# Patient Record
Sex: Female | Born: 1970 | Race: Black or African American | Hispanic: No | Smoking: Never smoker
Health system: Southern US, Community
[De-identification: ages and names within clinical notes are randomized; demographics above are authoritative.]

## PROBLEM LIST (undated history)

## (undated) DIAGNOSIS — R519 Headache, unspecified: Secondary | ICD-10-CM

## (undated) DIAGNOSIS — F32A Depression, unspecified: Secondary | ICD-10-CM

## (undated) DIAGNOSIS — F329 Major depressive disorder, single episode, unspecified: Secondary | ICD-10-CM

## (undated) DIAGNOSIS — G4700 Insomnia, unspecified: Secondary | ICD-10-CM

## (undated) DIAGNOSIS — Z8719 Personal history of other diseases of the digestive system: Secondary | ICD-10-CM

## (undated) DIAGNOSIS — N393 Stress incontinence (female) (male): Secondary | ICD-10-CM

## (undated) HISTORY — PX: ABDOMINAL HYSTERECTOMY: SHX81

## (undated) HISTORY — PX: CHOLECYSTECTOMY: SHX55

## (undated) HISTORY — DX: Depression, unspecified: F32.A

## (undated) HISTORY — DX: Major depressive disorder, single episode, unspecified: F32.9

## (undated) HISTORY — PX: TUBAL LIGATION: SHX77

---

## 1998-08-28 ENCOUNTER — Emergency Department (HOSPITAL_COMMUNITY): Admission: EM | Admit: 1998-08-28 | Discharge: 1998-08-28 | Payer: Self-pay | Admitting: Emergency Medicine

## 1998-08-29 ENCOUNTER — Other Ambulatory Visit: Admission: RE | Admit: 1998-08-29 | Discharge: 1998-08-29 | Payer: Self-pay | Admitting: Obstetrics and Gynecology

## 1998-09-03 ENCOUNTER — Emergency Department (HOSPITAL_COMMUNITY): Admission: EM | Admit: 1998-09-03 | Discharge: 1998-09-03 | Payer: Self-pay | Admitting: Otolaryngology

## 1999-07-19 ENCOUNTER — Emergency Department (HOSPITAL_COMMUNITY): Admission: EM | Admit: 1999-07-19 | Discharge: 1999-07-19 | Payer: Self-pay | Admitting: Emergency Medicine

## 2000-08-03 ENCOUNTER — Emergency Department (HOSPITAL_COMMUNITY): Admission: EM | Admit: 2000-08-03 | Discharge: 2000-08-03 | Payer: Self-pay | Admitting: Emergency Medicine

## 2001-10-25 ENCOUNTER — Ambulatory Visit (HOSPITAL_COMMUNITY): Admission: RE | Admit: 2001-10-25 | Discharge: 2001-10-25 | Payer: Self-pay | Admitting: Obstetrics and Gynecology

## 2001-10-25 ENCOUNTER — Encounter (INDEPENDENT_AMBULATORY_CARE_PROVIDER_SITE_OTHER): Payer: Self-pay | Admitting: Specialist

## 2002-06-14 ENCOUNTER — Encounter: Payer: Self-pay | Admitting: Emergency Medicine

## 2002-06-14 ENCOUNTER — Emergency Department (HOSPITAL_COMMUNITY): Admission: EM | Admit: 2002-06-14 | Discharge: 2002-06-15 | Payer: Self-pay | Admitting: Emergency Medicine

## 2002-06-15 ENCOUNTER — Emergency Department (HOSPITAL_COMMUNITY): Admission: EM | Admit: 2002-06-15 | Discharge: 2002-06-15 | Payer: Self-pay | Admitting: Emergency Medicine

## 2002-11-09 ENCOUNTER — Emergency Department (HOSPITAL_COMMUNITY): Admission: EM | Admit: 2002-11-09 | Discharge: 2002-11-09 | Payer: Self-pay | Admitting: Emergency Medicine

## 2003-01-10 ENCOUNTER — Encounter: Admission: RE | Admit: 2003-01-10 | Discharge: 2003-01-10 | Payer: Self-pay | Admitting: Family Medicine

## 2003-03-09 ENCOUNTER — Observation Stay (HOSPITAL_COMMUNITY): Admission: RE | Admit: 2003-03-09 | Discharge: 2003-03-10 | Payer: Self-pay | Admitting: Surgery

## 2003-03-09 ENCOUNTER — Encounter (INDEPENDENT_AMBULATORY_CARE_PROVIDER_SITE_OTHER): Payer: Self-pay | Admitting: *Deleted

## 2003-03-13 ENCOUNTER — Emergency Department (HOSPITAL_COMMUNITY): Admission: EM | Admit: 2003-03-13 | Discharge: 2003-03-13 | Payer: Self-pay | Admitting: Emergency Medicine

## 2004-07-14 ENCOUNTER — Ambulatory Visit (HOSPITAL_COMMUNITY): Admission: RE | Admit: 2004-07-14 | Discharge: 2004-07-14 | Payer: Self-pay | Admitting: Family Medicine

## 2004-08-07 ENCOUNTER — Inpatient Hospital Stay (HOSPITAL_COMMUNITY): Admission: RE | Admit: 2004-08-07 | Discharge: 2004-08-13 | Payer: Self-pay | Admitting: Psychiatry

## 2004-08-07 ENCOUNTER — Ambulatory Visit: Payer: Self-pay | Admitting: Psychiatry

## 2004-08-14 ENCOUNTER — Other Ambulatory Visit (HOSPITAL_COMMUNITY): Admission: RE | Admit: 2004-08-14 | Discharge: 2004-11-12 | Payer: Self-pay | Admitting: Psychiatry

## 2005-08-19 ENCOUNTER — Emergency Department (HOSPITAL_COMMUNITY): Admission: EM | Admit: 2005-08-19 | Discharge: 2005-08-19 | Payer: Self-pay | Admitting: Emergency Medicine

## 2007-07-06 ENCOUNTER — Other Ambulatory Visit (HOSPITAL_COMMUNITY): Admission: RE | Admit: 2007-07-06 | Discharge: 2007-07-21 | Payer: Self-pay | Admitting: Psychiatry

## 2007-07-07 ENCOUNTER — Ambulatory Visit: Payer: Self-pay | Admitting: Psychiatry

## 2007-07-22 ENCOUNTER — Ambulatory Visit (HOSPITAL_COMMUNITY): Payer: Self-pay | Admitting: Psychiatry

## 2008-08-13 ENCOUNTER — Ambulatory Visit (HOSPITAL_COMMUNITY): Admission: RE | Admit: 2008-08-13 | Discharge: 2008-08-14 | Payer: Self-pay | Admitting: Obstetrics and Gynecology

## 2008-08-13 ENCOUNTER — Encounter (INDEPENDENT_AMBULATORY_CARE_PROVIDER_SITE_OTHER): Payer: Self-pay | Admitting: Obstetrics and Gynecology

## 2009-02-10 ENCOUNTER — Emergency Department (HOSPITAL_COMMUNITY): Admission: EM | Admit: 2009-02-10 | Discharge: 2009-02-10 | Payer: Self-pay | Admitting: Family Medicine

## 2009-12-04 ENCOUNTER — Emergency Department (HOSPITAL_COMMUNITY): Admission: EM | Admit: 2009-12-04 | Discharge: 2009-12-05 | Payer: Self-pay | Admitting: Emergency Medicine

## 2010-05-26 LAB — POCT RAPID STREP A (OFFICE): Streptococcus, Group A Screen (Direct): NEGATIVE

## 2010-06-02 LAB — CBC
HCT: 27.5 % — ABNORMAL LOW (ref 36.0–46.0)
HCT: 29.4 % — ABNORMAL LOW (ref 36.0–46.0)
HCT: 36.4 % (ref 36.0–46.0)
Hemoglobin: 11.4 g/dL — ABNORMAL LOW (ref 12.0–15.0)
Hemoglobin: 8.8 g/dL — ABNORMAL LOW (ref 12.0–15.0)
Hemoglobin: 9.3 g/dL — ABNORMAL LOW (ref 12.0–15.0)
MCHC: 31.2 g/dL (ref 30.0–36.0)
MCHC: 31.7 g/dL (ref 30.0–36.0)
MCHC: 32 g/dL (ref 30.0–36.0)
MCV: 67.4 fL — ABNORMAL LOW (ref 78.0–100.0)
MCV: 68.2 fL — ABNORMAL LOW (ref 78.0–100.0)
MCV: 68.2 fL — ABNORMAL LOW (ref 78.0–100.0)
Platelets: 127 10*3/uL — ABNORMAL LOW (ref 150–400)
Platelets: 148 10*3/uL — ABNORMAL LOW (ref 150–400)
Platelets: 207 10*3/uL (ref 150–400)
RBC: 4.03 MIL/uL (ref 3.87–5.11)
RBC: 4.32 MIL/uL (ref 3.87–5.11)
RBC: 5.4 MIL/uL — ABNORMAL HIGH (ref 3.87–5.11)
RDW: 18.1 % — ABNORMAL HIGH (ref 11.5–15.5)
RDW: 18.4 % — ABNORMAL HIGH (ref 11.5–15.5)
RDW: 18.8 % — ABNORMAL HIGH (ref 11.5–15.5)
WBC: 10 10*3/uL (ref 4.0–10.5)
WBC: 5.7 10*3/uL (ref 4.0–10.5)
WBC: 8.3 10*3/uL (ref 4.0–10.5)

## 2010-06-02 LAB — BASIC METABOLIC PANEL
BUN: 4 mg/dL — ABNORMAL LOW (ref 6–23)
CO2: 27 mEq/L (ref 19–32)
Calcium: 8.7 mg/dL (ref 8.4–10.5)
Chloride: 109 mEq/L (ref 96–112)
Creatinine, Ser: 0.69 mg/dL (ref 0.4–1.2)
GFR calc Af Amer: >60 mL/min (ref >60)
GFR calc non Af Amer: >60 mL/min (ref >60)
Glucose, Bld: 124 mg/dL — ABNORMAL HIGH (ref 70–99)
Potassium: 3.4 mEq/L — ABNORMAL LOW (ref 3.5–5.1)
Sodium: 140 mEq/L (ref 135–145)

## 2010-06-02 LAB — HCG, SERUM, QUALITATIVE: Preg, Serum: NEGATIVE

## 2010-07-08 NOTE — H&P (Signed)
NAME:  Pamela Best, Pamela Best NO.:  [id number]   MEDICAL RECORD NO.:  [id number]          PATIENT TYPE:  AMB   LOCATION:  SDC                           FACILITY:  [facility]   PHYSICIAN:  Osborn Coho, M.D.   DATE OF BIRTH:  Jun 28, 1970   DATE OF ADMISSION:  DATE OF DISCHARGE:                              HISTORY & PHYSICAL   HISTORY OF PRESENT ILLNESS:  Pamela Best is a 40 year old divorced  African American female, para 2-0-1-2, presenting for a total vaginal  hysterectomy because of symptomatic uterine fibroids, menometrorrhagia,  and anemia.  The patient reports that for the past several years, she  will have a 7-day menstrual flow which is preceded by spotting for 1  week.  During the patient's actual flow, she may change her pad which  contains large clots every 30 minutes to 4 hours.  Additionally, she  will experience 3/10 cramping (based on a 10-point pain scale).  However, she would find relief from Midol.  On June 02, 2008, the  patient was referred by [facility] after having experienced  4 weeks of such vaginal bleeding.  The patient's hemoglobin was 10.3.  She had a normal TSH, prolactin.  Negative pregnancy test and a normal  endometrial biopsy.  Pelvic ultrasound revealed a uterus measuring 11.2  x 5.5 x 5.7 cm with a posterior fibroid measuring 2.1 x 1.8 x 2.3 cm.  The patient's right ovary was 2.9 x 2.0 x 2.4 cm with a paraovarian cyst  measuring 0.9 cm and a left ovary measuring 3.1 x 3.9 x 2.6 cm and a  left paraovarian cyst measuring 1.2 cm.  The patient denies any changes  in her bowel movements, urinary tract symptoms, fever, or vaginitis  symptoms, but she did admit to occasional dyspareunia.  The patient was  given the option of managing her symptoms via observation, hormonal  therapy, or surgery, to include hysterectomy.  The patient has decided  to proceed with definitive therapy in the form of hysterectomy.   PAST MEDICAL HISTORY:  OB  History:  Gravida 3, para 2-0-1-2.  The  patient had two spontaneous vaginal births.  The largest baby weighing 9  pounds 5 ounces.  GYN History:  Menarche 40 years old.  Last menstrual  period:  Jul 21, 2008.  The patient uses bilateral tubal ligation as her  method of contraception.  Does have a remote history of an abnormal Pap  smear in 1988.  She underwent cryotherapy of her cervix.  Pap smears  have been normal since.  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  The patient's last normal Pap smear was March 2010.   MEDICAL HISTORY:  1. Sickle cell trait anemia.  2. Depression.  3. Ankle fracture.   SURGICAL HISTORY:  1. 1994 bilateral tubal ligation.  2. 2005 cholecystectomy.   She denies any history of blood transfusions or problems with  anesthesia.   FAMILY HISTORY:  Positive for asthma, thyroid disease, anemia, seizures,  migraines, and breast cancer (maternal grandmother greater than age 23  and maternal aunt less than  age 21).   HABITS:  The patient does not use tobacco or illicit drugs.  She  occasionally consumes alcohol.   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   MEDICATIONS:  Valtrex 500 mg twice daily for 3 days as needed.   ALLERGIES:  The patient has no known drug allergies.  She denies any  sensitivity to latex or to shellfish.   REVIEW OF SYSTEMS:  The patient denies any chest pain, shortness of  breath, headache, vision changes, nausea, vomiting, diarrhea, fever,  pelvic pain, joint pain, skin rash, recent weight loss, night sweats,  and except as is mentioned in history of present illness.  The patient's  review of systems is otherwise negative.   PHYSICAL EXAMINATION:  VITAL SIGNS:  Blood pressure 108/72 and weight is  171.  EAR, NOSE, and THROAT:  Pupils are equal.  Hearing normal.  Throat  clear.  NECK:  Supple without masses.  There is no thyromegaly or cervical   adenopathy.  HEART:  Regular rate and rhythm.  LUNGS:  Clear.  BACK:  No CVA tenderness.  ABDOMEN:  No tenderness, masses, or organomegaly.  EXTREMITIES:  No clubbing, cyanosis, or edema.  NEUROLOGIC:  Within normal limits.  PELVIC:  EGBUS is normal.  Vagina is normal.  Cervix is nontender  without lesions.  Uterus appears 10-12 weeks' size without tenderness.  Adnexa without tenderness or masses.   IMPRESSION:  1. Menometrorrhagia.  2. Symptomatic uterine fibroids.  3. Anemia.   DISPOSITION:  A discussion was held with the patient regarding the  indications for her procedure along with its risks which include but are  not limited to reaction to anesthesia, damage to adjacent organs,  infection, excessive bleeding, premature ovarian failure, and a  possibility that an open abdominal incision may be necessary to complete  her surgery safely.  The patient verbalized understanding of these risk  and wishes to proceed with a total vaginal hysterectomy and the  possibility of a total abdominal hysterectomy at [facility] on August 13, 2008, at 9:30 a.m.      [place] J. Adline Peals.      Osborn Coho, M.D.  Electronically Signed    EJP/MEDQ  D:  08/09/2008  T:  08/10/2008  Job:  [id number]

## 2010-07-08 NOTE — Op Note (Signed)
NAME:  Pamela Best, Pamela Best              ACCOUNT NO.:  [id number]   MEDICAL RECORD NO.:  [id number]          PATIENT TYPE:  OIB   LOCATION:  9317                          FACILITY:  [facility]   PHYSICIAN:  Osborn Coho, M.D.   DATE OF BIRTH:  26-Jan-1971   DATE OF PROCEDURE:  08/13/2008  DATE OF DISCHARGE:                               OPERATIVE REPORT   PREOPERATIVE DIAGNOSES:  1. Menometrorrhagia.  2. Symptomatic fibroid.  3. Dysmenorrhea.   POSTOPERATIVE DIAGNOSES:  1. Menometrorrhagia.  2. Symptomatic fibroid.  3. Dysmenorrhea.   PROCEDURES:  1. Total vaginal hysterectomy.  2. Cystoscopy.   ATTENDING DOCTOR:  Osborn Coho, MD   ASSISTANT:  Marquis Lunch. Adline Peals.   ANESTHESIA:  General.   SPECIMEN TO PATHOLOGY:  Uterus and cervix weighing 128.7 g.   FLUIDS:  1850 mL.   ESTIMATED BLOOD LOSS:  100 mL.   URINE OUTPUT:  150 mL.   COMPLICATIONS:  None.   PROCEDURE:  This patient was taken to the operating room after the  risks, benefits, and alternatives were discussed with the patient.  The  patient verbalized understanding and consent signed and witnessed.  The  patient was placed under general anesthesia and prepped and draped in  the normal sterile fashion in the dorsal lithotomy position.  The Foley  was replaced to gravity and the bladder was filled retrograde with  lactated Ringer's to help delineate the head of the bladder as it came  very close to the external os.  The bladder was then emptied completely  and Foley left to gravity.  A single-tooth tenaculum was placed on the  anterior lip of the cervix and the cervix injected with dilute  Pitressin.  The cervix was circumscribed with a Bovie and the anterior  cul-de-sac entered without difficulty after dissecting away the bladder  from the cervix.  The posterior cul-de-sac was then entered without  difficulty.  A curved Heaney was placed on the right uterosacral  ligament, clamped and cut and suture ligated and the  same was done on  the contralateral side.  The Bonanno weighted speculum was then placed  in the posterior cul-de-sac and the curved Heaney was used to clamp the  paracervical tissue incorporating the cardinal ligament which was then  cut and suture ligated with 0 Vicryl.  The same was done on the  contralateral side.  In a sequential and bilateral fashion, the  parametrial tissue was clamped with a curved Heaney, cut and suture  ligated with 0 Vicryl and carried up to the utero-ovarian pedicles.  There is a question of whether or not there was a posterior fibroid  which was then presented to be excised out; however, it was mostly the  body of the uterus.  The remaining utero-ovarian pedicle on the  patient's right was then clamped with a large Kelly, cut and suture  ligated x2 with 0 Vicryl.  The same was done on the contralateral side;  however, there was some bleeding noted on that side and several  additional stitches of 0 Vicryl placed for hemostasis.  Good hemostasis  was noted.  The cuff was noted to be bleeding on the left which was made  hemostatic with 3-0 Vicryl figure-of-eight stitch.  The irrigation was  performed and no obvious bleeding noted.  The peritoneum was grasped  with the vaginal mucosa and the weighted speculum was replaced with a  shorter weighted speculum.  A stitch at the start of the case had been  placed in the posterior cul-de-sac and a McCall culdoplasty stitch was  placed after doing the angle stitch on the patient's right with 0 Vicryl  interrupted stitch.  A free needle was placed on the suture, grasping  the uterosacral from earlier in the case and an angle stitch was tied  after carrying the ends through the vaginal mucosa.  The cuff was  repaired with 0 Vicryl via figure-of-eight stitches and good hemostasis  was noted.  The McCall culdoplasty stitch was then tied.  Indigo carmine  had been administered and cystoscopy was performed after prepping the   urethra with Betadine and bilateral ureters were noted to efflux the  indigo carmine without difficulty.  The cystoscope was then removed, and  Foley placed to gravity.  Sponge, lap, and needle count were correct.  The patient tolerated the procedure well and is currently awaiting  extubation and transfer to the recovery room in good condition.      Osborn Coho, M.D.  Electronically Signed     AR/MEDQ  D:  08/13/2008  T:  08/14/2008  Job:  [id number]

## 2010-07-11 NOTE — Op Note (Signed)
NAME:  Pamela Best, Pamela Best                        ACCOUNT NO.:  [id number]   MEDICAL RECORD NO.:  [id number]                   PATIENT TYPE:  OBV   LOCATION:  0470                                 FACILITY:  [facility]   PHYSICIAN:  Sandria Bales. Ezzard Standing, M.D.               DATE OF BIRTH:  [birth year]   DATE OF PROCEDURE:  03/09/2003  DATE OF DISCHARGE:                                 OPERATIVE REPORT   PREOPERATIVE DIAGNOSIS:  Chronic cholecystitis with cholelithiasis.   POSTOPERATIVE DIAGNOSIS:  Chronic cholecystitis with cholelithiasis.   OPERATION/PROCEDURE:  Laparoscopic cholecystectomy with intraoperative  cholangiogram.   SURGEON:  Sandria Bales. Ezzard Standing, M.D.   FIRST ASSISTANT:  Sharlet Salina T. Hoxworth, M.D.   ANESTHESIA:  General endotracheal anesthesia.   ESTIMATED BLOOD LOSS:  Minimal.   INDICATIONS:  Pamela Best is a 40 year old black female who has had some  vague abdominal pain, maybe three or four attacks, over the last year.  She  had an ultrasound which was on January 10, 2003 which shows multiple  gallstones.  This was done at [facility].  She now comes  for laparoscopic cholecystectomy.  Indications and potential complications  were explained to the patient.  The potential complications include but are  not limited to bleeding, infection, bile duct injury, and open surgery.   DESCRIPTION OF PROCEDURE:  The patient was taken to the operating room where  she underwent a general endotracheal anesthesia.  This was supervised by Dr.  Sherrian Divers.  She was given 1 g of Ancef __________, had PAS stockings in  place.  Her abdomen was prepped with Betadine solution and sterilely draped.   She had a prior infraumbilical incision and I went through this incision to  get to the abdominal cavity.  I entered the abdominal cavity without  difficulty and placed an Hasson trocar, secured this with a 0 Vicryl suture.  I did a laparoscopic exploration revealing the right and  left lobes of the  liver unremarkable, anterior wall of the stomach unremarkable.  I could see  her colon.  Her bowel was otherwise mildly dilated but unremarkable.  There  was no mass or nodularity.  Three additional trocars were placed.  A 10 mm  Ethicon trocar in the subxiphoid location, a 5 mm Ethicon trocar in the  right mid subcostal location and a 5 mm Ethicon trocar in the right lateral  subcostal location.  The gallbladder was identified and grasped.  There was  noted to be some adhesions along the anterior gallbladder wall which had the  duodenum kind of pulled up to it.  These were fairly filmy adhesions cut  down to the cystic duct/gallbladder junction where I encircled the cystic  duct, placed endoclip along the gallbladder side of the cystic duct.   I then obtained an intraoperative cholangiogram.  I used the cut-off taut  catheter inserted through a 14-gauge Jelco catheter into the side  of the cut  cystic duct.   I then shot __________ mL of half-strength Hypaque solution under  fluoroscopy showing free flow of contrast down the cystic duct, into the  common bile duct, into the duodenum, and about the hepatic right angles.  Actually, the patient had a fairly long cystic duct and actually, size-wise  it was approximated the common bile duct.  There was no filling defect, no  mass.  It was felt to be a normal intraoperative cholangiogram.  I then  removed the Taut catheter.   I placed three clips on the cystic duct and divided the cystic duct.  I then  identified the cystic artery, placed two clips on that and divided the  cystic artery.  I then identified the triangle of Calot.  There was no  bleeding or bile leak from the triangle.  I then sharply and bluntly  dissected the gallbladder from the gallbladder bed using primarily hook  Bovie coagulation.  Prior to complete division of the gallbladder from the  gallbladder bed, I revisualized the triangle of Calot and the  gallbladder  bed.  There was no bleeding or bile leak.   The gallbladder was then removed, delivered through the umbilicus intact and  sent to pathology.  I irrigated the abdomen with about 200-300 mL of saline.  I then removed the trocars under direct visualization.  The umbilicus trocar  was closed with 0 Vicryl suture.  Skin at each site was closed with 5-0  Monocryl suture, painted with tincture of Benzoin and steri-stripped.   Sponge and needle counts were correct at the end of the case.  The patient  tolerated the procedure well and was transported to the recovery room in  good condition.                                               Sandria Bales. Ezzard Standing, M.D.    DHN/MEDQ  D:  03/09/2003  T:  03/09/2003  Job:  [id number]   cc:   Duwayne Heck L. Mahaffey, M.D.  [address]  [place]  [place] [address]  Fax: [phone]

## 2010-07-11 NOTE — Op Note (Signed)
NAME:  Pamela Best, Pamela Best                        ACCOUNT NO.:  [id number]   MEDICAL RECORD NO.:  [id number]                   PATIENT TYPE:  AMB   LOCATION:  SDC                                  [facility]ILITY:  [facility]   PHYSICIAN:  Malva Limes, MD                   DATE OF BIRTH:  1970-06-24   DATE OF PROCEDURE:  10/25/2001  DATE OF DISCHARGE:                                 OPERATIVE REPORT   PREOPERATIVE DIAGNOSES:  1. Menometrorrhagia.  2. Dysmenorrhea.  3. Postcoital bleeding.   POSTOPERATIVE DIAGNOSES:  1. Menometrorrhagia.  2. Dysmenorrhea.  3. Postcoital bleeding.  4. Small endocervical polyp.  5. Multiple small endometrial polyps.   PROCEDURE:  1. Dilatation and curettage.  2. Hysteroscopy.   SURGEON:  Malva Limes, MD   ANESTHESIA:  General.   ESTIMATED BLOOD LOSS:  25 cc.   ANTIBIOTICS:  Ancef 1 g.   SPECIMENS:  Endometrial and endocervical curettings to pathology.   COMPLICATIONS:  None.   DRAINS:  Red rubber catheter to bladder.   PROCEDURE:  The patient was taken to the operating room where she was placed  in a dorsal supine position after general anesthesia was administered  without complications.  She was then placed in the dorsal lithotomy position  and prepped with  Hibiclens.  She was draped in the usual fashion for this  procedure.  A sterile speculum was placed in the vagina.  Single tooth  tenaculum was applied to the anterior cervical lip.  The uterus was then  sounded to 9 cm.  The cervical os was then serially dilated to a 31 [place].  The hysteroscope was advanced through the endocervical canal where a small  endocervical polyp was seen to arise from 9 o'clock.  On entering the  uterine cavity both ostia were visualized and appeared to be normal.  There  were small endometrial polyps seen within the ostia.  There was also one  small endometrial polyp seen on the anterior fundal surface.  At this point  the hysteroscope was removed.  Endocervical  curettage was performed followed  by endometrial curetting.  Copious amounts of endometrial tissue were  removed.  This was all sent to pathology.  The patient tolerated the  procedure well.  She was taken to the  recovery room in stable condition.  Instrument and lap counts were correct  x1.  The patient will be discharged to home.  She will be sent home with the  Anaprox double strength to take p.r.n.  She will follow up in the office in  four weeks.                                               Malva Limes, MD    MA/MEDQ  D:  10/25/2001  T:  10/25/2001  Job:  [id number]

## 2010-07-11 NOTE — Discharge Summary (Signed)
NAME:  Pamela Best, Pamela Best NO.:  [id number]   MEDICAL RECORD NO.:  [id number]          PATIENT TYPE:  IPS   LOCATION:  0506                          FACILITY:  [facility]   PHYSICIAN:  Geoffery Lyons, M.D.      DATE OF BIRTH:  Apr 04, 1970   DATE OF ADMISSION:  08/07/2004  DATE OF DISCHARGE:  08/13/2004                                 DISCHARGE SUMMARY   CHIEF COMPLAINT AND PRESENT ILLNESS:  This was the first admission to [facility] for this 40 year old divorced African-American female  voluntarily admitted.  History of depression.  Going through a bad  relationship, crying, suicidal ideation, cutting herself, would not know how  to do it, not eating, not sleeping, isolating.  Had bottle of liquor and  drank it to go to sleep, racing thoughts, worried about going home and being  alone.   PAST PSYCHIATRIC HISTORY:  First time at [facility].   ALCOHOL/DRUG HISTORY:  Denies active use of any substances.   MEDICAL HISTORY:  Noncontributory.   MEDICATIONS:  Valtrex.   PHYSICAL EXAMINATION:  Performed and failed to show any acute findings.   LABORATORY DATA:  CBC with white blood cells 8.4, hematocrit 38.3.  Blood  chemistry with potassium 3.3, glucose 110.  SGOT 19, SGPT 14.   MENTAL STATUS EXAM:  Fully alert, cooperative female.  Fair eye contact.  Speech clear, normal rate, tempo and production.  Mood depressed, hurting.  Affect depressed.  Thought processes logical, coherent and relevant.  No  delusions.  No active suicidal or homicidal ideation.  No hallucinations.  Cognition was well-preserved.   ADMISSION DIAGNOSES:   AXIS I:  Depressive disorder not otherwise specified.   AXIS II:  No diagnosis.   AXIS III:  Genital herpes.   AXIS IV:  Moderate.   AXIS V:  Global Assessment of Functioning upon admission 30; highest Global  Assessment of Functioning in the last year 65-70.   HOSPITAL COURSE:  She was admitted.  She was started in  individual and group  psychotherapy.  She was given Ambien 10 mg at bedtime for sleep, Ativan 0.5  mg every six hours as needed for anxiety.  She was given Risperdal 0.5 mg at  night and she was started on Zoloft 25 mg per day, that was increased to  Zoloft 50 mg per day.  Risperdal was increased to 0.5 mg at bedtime.  Initially very depressed and tearful.  Broke up with her partner and felt  suicidal.  Sense of hopelessness, feeling of racing thoughts, no hope.  Tolerating the Zoloft and the Risperdal well.  She was able to open up and  told how the breakup has distressed her.  Endorsed that the medications were  helping her to sleep and quiet her thoughts.  On June 19th, endorsed that  she continued to have a hard time, broke up with the partner, same-sex).  She was working in the school system.  She was not working during the  summer.  Endorsed difficulty with the fact that the children were going to  stay with their  father.  She has been isolating, feeling worse.  She was  able to start opening up, feeling a sense of loss, ruminating about the  relationship, tearful, able to open up and fully express her feelings.  Endorsed poor self-esteem, poor self-image.  Second-guessing herself.  Very  upset.  Continued to work on the grief.  There was a family session with her  sister and mother.  She was able to process some other feelings and, on June  21st, she felt better.  Endorsed no suicidal ideation, no homicidal  ideation, no hallucinations, no delusions.  Evidenced increased insight.  Dealing with the loss but willing to pursue further treatment.  Was coming  to intensive outpatient program for further stabilization.   DISCHARGE DIAGNOSES:   AXIS I:  Major depression, single episode.   AXIS II:  No diagnosis.   AXIS III:  Genital herpes.   AXIS IV:  Moderate.   AXIS V:  Global Assessment of Functioning upon discharge 50.   DISCHARGE MEDICATIONS:  1.  Zoloft 50 mg per day.  2.   Risperdal 0.5 mg at night.  3.  Ambien 10 mg at night for sleep.   FOLLOW UP:  Mental health intensive outpatient program at [facility].       IL/MEDQ  D:  09/04/2004  T:  09/05/2004  Job:  [id number]

## 2010-10-17 ENCOUNTER — Inpatient Hospital Stay (INDEPENDENT_AMBULATORY_CARE_PROVIDER_SITE_OTHER)
Admission: RE | Admit: 2010-10-17 | Discharge: 2010-10-17 | Disposition: A | Discharge status: Home / Self Care | Payer: BC Managed Care – PPO | Source: Ambulatory Visit | Attending: Emergency Medicine | Admitting: Emergency Medicine

## 2010-10-17 DIAGNOSIS — M65839 Other synovitis and tenosynovitis, unspecified forearm: Secondary | ICD-10-CM

## 2010-10-17 DIAGNOSIS — M65849 Other synovitis and tenosynovitis, unspecified hand: Secondary | ICD-10-CM

## 2010-12-19 ENCOUNTER — Inpatient Hospital Stay (INDEPENDENT_AMBULATORY_CARE_PROVIDER_SITE_OTHER)
Admission: RE | Admit: 2010-12-19 | Discharge: 2010-12-19 | Disposition: A | Discharge status: Home / Self Care | Payer: BC Managed Care – PPO | Source: Ambulatory Visit | Attending: Family Medicine | Admitting: Family Medicine

## 2010-12-19 DIAGNOSIS — H109 Unspecified conjunctivitis: Secondary | ICD-10-CM

## 2010-12-19 DIAGNOSIS — J029 Acute pharyngitis, unspecified: Secondary | ICD-10-CM

## 2010-12-19 LAB — POCT RAPID STREP A: Streptococcus, Group A Screen (Direct): NEGATIVE

## 2011-03-23 ENCOUNTER — Other Ambulatory Visit: Payer: Self-pay | Admitting: Obstetrics and Gynecology

## 2011-03-23 DIAGNOSIS — Z1231 Encounter for screening mammogram for malignant neoplasm of breast: Secondary | ICD-10-CM

## 2011-03-31 ENCOUNTER — Ambulatory Visit: Payer: BC Managed Care – PPO

## 2012-04-11 ENCOUNTER — Other Ambulatory Visit: Payer: Self-pay | Admitting: Obstetrics and Gynecology

## 2013-07-11 ENCOUNTER — Other Ambulatory Visit: Payer: Self-pay

## 2013-07-11 DIAGNOSIS — Z1231 Encounter for screening mammogram for malignant neoplasm of breast: Secondary | ICD-10-CM

## 2013-07-21 ENCOUNTER — Encounter (INDEPENDENT_AMBULATORY_CARE_PROVIDER_SITE_OTHER): Payer: Self-pay

## 2013-07-21 ENCOUNTER — Ambulatory Visit
Admission: RE | Admit: 2013-07-21 | Discharge: 2013-07-21 | Disposition: A | Discharge status: Home / Self Care | Payer: BC Managed Care – PPO | Source: Ambulatory Visit

## 2013-07-21 DIAGNOSIS — Z1231 Encounter for screening mammogram for malignant neoplasm of breast: Secondary | ICD-10-CM

## 2014-07-31 ENCOUNTER — Telehealth: Payer: Self-pay | Admitting: Internal Medicine

## 2014-07-31 NOTE — Telephone Encounter (Signed)
Called pt to scheduled new pt appt. left message

## 2014-09-03 ENCOUNTER — Telehealth: Payer: Self-pay | Admitting: Internal Medicine

## 2014-09-03 NOTE — Telephone Encounter (Signed)
LEFT MESSAGE FOR PATIENT TO CONFIRM NP APPT FOR 07/12 @ 10:30 W/DR. MOHAMED

## 2014-09-04 ENCOUNTER — Ambulatory Visit (HOSPITAL_BASED_OUTPATIENT_CLINIC_OR_DEPARTMENT_OTHER): Payer: BC Managed Care – PPO | Admitting: Hematology

## 2014-09-04 ENCOUNTER — Ambulatory Visit: Payer: BC Managed Care – PPO

## 2014-09-04 ENCOUNTER — Ambulatory Visit (HOSPITAL_BASED_OUTPATIENT_CLINIC_OR_DEPARTMENT_OTHER): Payer: BC Managed Care – PPO

## 2014-09-04 ENCOUNTER — Telehealth: Payer: Self-pay | Admitting: Hematology

## 2014-09-04 ENCOUNTER — Encounter: Payer: Self-pay | Admitting: Hematology

## 2014-09-04 ENCOUNTER — Encounter (INDEPENDENT_AMBULATORY_CARE_PROVIDER_SITE_OTHER): Payer: Self-pay

## 2014-09-04 VITALS — BP 117/75 | HR 123 | Temp 98.2°F | Resp 18 | Wt 208.7 lb

## 2014-09-04 DIAGNOSIS — D649 Anemia, unspecified: Secondary | ICD-10-CM

## 2014-09-04 LAB — CBC & DIFF AND RETIC
BASO%: 0.3 % (ref 0.0–2.0)
Basophils Absolute: 0 10*3/uL (ref 0.0–0.1)
EOS%: 0.9 % (ref 0.0–7.0)
Eosinophils Absolute: 0.1 10*3/uL (ref 0.0–0.5)
HCT: 38.5 % (ref 34.8–46.6)
HGB: 12.2 g/dL (ref 11.6–15.9)
Immature Retic Fract: 6.6 % (ref 1.60–10.00)
LYMPH%: 31.6 % (ref 14.0–49.7)
MCH: 21.9 pg — ABNORMAL LOW (ref 25.1–34.0)
MCHC: 31.7 g/dL (ref 31.5–36.0)
MCV: 69.2 fL — ABNORMAL LOW (ref 79.5–101.0)
MONO#: 0.3 10*3/uL (ref 0.1–0.9)
MONO%: 5.2 % (ref 0.0–14.0)
NEUT#: 4 10*3/uL (ref 1.5–6.5)
NEUT%: 62 % (ref 38.4–76.8)
Platelets: 249 10*3/uL (ref 145–400)
RBC: 5.56 10*6/uL — ABNORMAL HIGH (ref 3.70–5.45)
RDW: 17 % — ABNORMAL HIGH (ref 11.2–14.5)
Retic %: 1.4 % (ref 0.70–2.10)
Retic Ct Abs: 77.84 10*3/uL (ref 33.70–90.70)
WBC: 6.4 10*3/uL (ref 3.9–10.3)
lymph#: 2 10*3/uL (ref 0.9–3.3)

## 2014-09-04 LAB — MORPHOLOGY: PLT EST: ADEQUATE

## 2014-09-04 LAB — IRON AND TIBC CHCC
%SAT: 11 % — ABNORMAL LOW (ref 21–57)
Iron: 41 ug/dL (ref 41–142)
TIBC: 361 ug/dL (ref 236–444)
UIBC: 320 ug/dL (ref 120–384)

## 2014-09-04 LAB — FERRITIN CHCC: Ferritin: 76 ng/ml (ref 9–269)

## 2014-09-04 NOTE — Progress Notes (Signed)
Checked in new pt with no financial concerns. °

## 2014-09-04 NOTE — Progress Notes (Signed)
[facility]  Telephone:([phone] [phone]  Clinic New consult Note   Patient Care Team: Dorothyann Pengobyn Sanders, MD as PCP - General (Internal Medicine) 09/04/2014  CHIEF COMPLAINTS/PURPOSE OF CONSULTATION:  Anemia   HISTORY OF PRESENTING ILLNESS:  Pamela Best 44 y.o. female is here because of anemia   She was found to have abnormal CBC from about 23 years ago, when she had her first child.  She denies recent chest pain on exertion, she does have shortness of breath on heavy exertion, no pre-syncopal episodes, or palpitations. She had not noticed any recent bleeding such as epistaxis, hematuria or hematochezia The patient denies over the counter NSAID ingestion. She is not on antiplatelets agents. Her never had colonoscopy.  She had no prior history or diagnosis of cancer. Her age appropriate screening programs are up-to-date. She denies any pica and eats a variety of diet. She never donated blood or received blood transfusion The patient was prescribed oral iron supplements and she takes fusion plus 1 tab daily for the past year.   She had hysrectomy in 2010, she had heavy menstrual period (lasts 30 days every a few months) for a few years before her surgery.   She feels well overall, has mild fatigue. She works full-time at [employer], tolerating her routine activities without difficult. She complains of mild numbness and tingling on fingers and toes, and some time skin color change (white) on fingers.   MEDICAL HISTORY:  Past Medical History  Diagnosis Date  . Depression     SURGICAL HISTORY: Past Surgical History  Procedure Laterality Date  . Cholecystectomy    . Abdominal hysterectomy      SOCIAL HISTORY: History   Social History  . Marital Status: Divorced    Spouse Name: N/A  . Number of Children: 2, age of 46 and 7420  . Years of Education: N/A   Occupational History  . She works for [employer]   Social  History Main Topics  . Smoking status: Never Smoker   . Smokeless tobacco: Not on file  . Alcohol Use: Yes     Comment: socail drinker   . Drug Use: No  . Sexual Activity: Not on file   Other Topics Concern  . Not on file   Social History Narrative  . No narrative on file    FAMILY HISTORY: Family History  Problem Relation Age of Onset  . Hypothyroidism Mother   . Thalassemia Mother   . Cancer Father [id number]    prostate cancer   . Hypertension Sister   . Cancer Maternal Grandmother     breast cancer   . Cancer Maternal Grandfather     bone cancer    ALLERGIES:  has No Known Allergies.  MEDICATIONS:  Current Outpatient Prescriptions  Medication Sig Dispense Refill  . escitalopram (LEXAPRO) 20 MG tablet Take 20 mg by mouth daily.  2  . Iron-FA-B Cmp-C-Biot-Probiotic (FUSION PLUS) CAPS Take 1 capsule by mouth daily.    Marland Kitchen. levocetirizine (XYZAL) 5 MG tablet Take 5 mg by mouth at bedtime.  5  . LIVIXIL PAK cream Apply topically as needed.    Marland Kitchen. omeprazole (PRILOSEC) 40 MG capsule Take 40 mg by mouth daily.  5  . valACYclovir (VALTREX) 500 MG tablet TAKE 1 TABLET BY MOUTH EVERY DAY 30 tablet PRN  . Vitamin D, Ergocalciferol, (DRISDOL) 50000 UNITS CAPS capsule TK ONE C PO TWICE WEEKLY  0   No current facility-administered medications for  this visit.    REVIEW OF SYSTEMS:   Constitutional: Denies fevers, chills or abnormal night sweats Eyes: Denies blurriness of vision, double vision or watery eyes Ears, nose, mouth, throat, and face: Denies mucositis or sore throat Respiratory: Denies cough, dyspnea or wheezes Cardiovascular: Denies palpitation, chest discomfort or lower extremity swelling Gastrointestinal:  Denies nausea, heartburn or change in bowel habits Skin: Denies abnormal skin rashes Lymphatics: Denies new lymphadenopathy or easy bruising Neurological:Denies numbness, tingling or new weaknesses Behavioral/Psych: Mood is stable, no new changes  All other systems were  reviewed with the patient and are negative.  PHYSICAL EXAMINATION: ECOG PERFORMANCE STATUS: 0 - Asymptomatic  Filed Vitals:   09/04/14 1059  BP: 117/75  Pulse: 123  Temp: 98.2 F (36.8 C)  Resp: 18   Filed Weights   09/04/14 1059  Weight: 208 lb 11.2 oz (94.666 kg)    GENERAL:alert, no distress and comfortable SKIN: skin color, texture, turgor are normal, no rashes or significant lesions EYES: normal, conjunctiva are pink and non-injected, sclera clear OROPHARYNX:no exudate, no erythema and lips, buccal mucosa, and tongue normal  NECK: supple, thyroid normal size, non-tender, without nodularity LYMPH:  no palpable lymphadenopathy in the cervical, axillary or inguinal LUNGS: clear to auscultation and percussion with normal breathing effort HEART: regular rate & rhythm and no murmurs and no lower extremity edema ABDOMEN:abdomen soft, non-tender and normal bowel sounds Musculoskeletal:no cyanosis of digits and no clubbing  PSYCH: alert & oriented x 3 with fluent speech NEURO: no focal motor/sensory deficits  LABORATORY DATA:  I have reviewed the data as listed CBC Latest Ref Rng 09/04/2014 08/14/2008 08/13/2008  WBC 3.9 - 10.3 10e3/uL 6.4 8.3 10.0  Hemoglobin 11.6 - 15.9 g/dL 16.1 0.9(U) 9.3 DELTA CHECK NOTED(L)  Hematocrit 34.8 - 46.6 % 38.5 27.5(L) 29.4(L)  Platelets 145 - 400 10e3/uL 249 127 PLATELET COUNT CONFIRMED BY SMEAR(L) 148 DELTA CHECK NOTED(L)    CMP Latest Ref Rng 08/14/2008  Glucose 70 - 99 mg/dL 045(W)  BUN 6 - 23 mg/dL 4(L)  Creatinine 0.4 - 1.2 mg/dL 0.98  Sodium 119 - 147 mEq/L 140  Potassium 3.5 - 5.1 mEq/L 3.4(L)  Chloride 96 - 112 mEq/L 109  CO2 19 - 32 mEq/L 27  Calcium 8.4 - 10.5 mg/dL 8.7    Her outside lab from 06/29/2014 reviewed CBC: Hemoglobin 11.9, MCV 66, WBC 5.7K, platelet 264K TSH 2.09, normal Vitamin B12: 412, normal CMP: Within normal limits except serum glucose 100 mg/dl   Labs from 82/95/6213: CBC: Hemoglobin 12.3, hematocrit 37.7,  MCV 66, platelet 250 1K, WBC 6.4K Serum iron 42, U IBC 323, iron saturation 12% Ferritin 50, E 12 305, HbA1c 6.1%  RADIOGRAPHIC STUDIES: I have personally reviewed the radiological images as listed and agreed with the findings in the report. No results found.  ASSESSMENT & PLAN:  44 year old African-American female, with family history of thalassemia in her mother, presented with long-standing history of mild anemia.   1. Microcytic anemia -She has a mild anemia with a hemoglobin around 12g/dl, with profound low MCV in mid 60 range. She has family history of thalassemia in her mother, she most likely is a thalassemia carrier. She does not know which type of thalassemia her mother has. -I'll check hemoglobin electrophoresis to see if she has beta thalassemia. If her hemoglobin electrophoresis normal, I'll check thalassemia off gene mutation. -I'll also repeat ferritin and iron study, to see if she has iron deficiency. TSH was previous checked which was normal. -I think is  the other etiology of anemia is much less likely, giving the profound low MCV.  -Giving her peripheral neuropathy, I'll also check methylmalonic acid level, to see if she has B12 deficiency. Her serum B12 level was normal.  2. Depression -Continue medication  3. Peripheral neuropathy -Unknown etiology. I'll check methylmalonic acid level  Follow-up: I'll see her back in 1 month's to review the above test results.   All questions were answered. The patient knows to call the clinic with any problems, questions or concerns. I spent 40 minutes counseling the patient face to face. The total time spent in the appointment was 55 minutes and more than 50% was on counseling.     Malachy Mood, MD 09/05/2014 9:24 AM

## 2014-09-04 NOTE — Telephone Encounter (Signed)
Pt confirmed labs/ov per 07/12 POF, gave pt AVS and Calendar... KJ °

## 2014-09-07 LAB — HEMOGLOBINOPATHY EVALUATION
HEMOGLOBIN OTHER: 0 %
HGB A2 QUANT: 5.5 % — AB (ref 2.2–3.2)
Hgb A: 91.1 % — ABNORMAL LOW (ref 96.8–97.8)
Hgb F Quant: 3.4 % — ABNORMAL HIGH (ref 0.0–2.0)
Hgb S Quant: 0 %

## 2014-09-07 LAB — METHYLMALONIC ACID, SERUM: Methylmalonic Acid, Quant: 80 nmol/L — ABNORMAL LOW (ref 87–318)

## 2014-10-05 ENCOUNTER — Ambulatory Visit (HOSPITAL_BASED_OUTPATIENT_CLINIC_OR_DEPARTMENT_OTHER): Payer: BC Managed Care – PPO | Admitting: Hematology

## 2014-10-05 ENCOUNTER — Telehealth: Payer: Self-pay | Admitting: Hematology

## 2014-10-05 ENCOUNTER — Encounter: Payer: Self-pay | Admitting: Hematology

## 2014-10-05 VITALS — BP 126/68 | HR 92 | Temp 98.9°F | Resp 18 | Ht 62.0 in | Wt 214.1 lb

## 2014-10-05 DIAGNOSIS — G629 Polyneuropathy, unspecified: Secondary | ICD-10-CM | POA: Diagnosis not present

## 2014-10-05 DIAGNOSIS — F329 Major depressive disorder, single episode, unspecified: Secondary | ICD-10-CM

## 2014-10-05 DIAGNOSIS — D563 Thalassemia minor: Secondary | ICD-10-CM

## 2014-10-05 DIAGNOSIS — D509 Iron deficiency anemia, unspecified: Secondary | ICD-10-CM | POA: Diagnosis not present

## 2014-10-05 DIAGNOSIS — R05 Cough: Secondary | ICD-10-CM

## 2014-10-05 NOTE — Progress Notes (Signed)
[facility]  Telephone:(336) [phone] Fax:(336) [phone]  Clinic New consult Note   Patient Care Team: Dorothyann Peng, MD as PCP - General (Internal Medicine) 10/05/2014   CHIEF COMPLAINTS/PURPOSE OF CONSULTATION:  Anemia   HISTORY OF PRESENTING ILLNESS:  Pamela Best 44 y.o. female is here because of anemia   She was found to have abnormal CBC from about 23 years ago, when she had her first child.  She denies recent chest pain on exertion, she does have shortness of breath on heavy exertion, no pre-syncopal episodes, or palpitations. She had not noticed any recent bleeding such as epistaxis, hematuria or hematochezia The patient denies over the counter NSAID ingestion. She is not on antiplatelets agents. Her never had colonoscopy.  She had no prior history or diagnosis of cancer. Her age appropriate screening programs are up-to-date. She denies any pica and eats a variety of diet. She never donated blood or received blood transfusion The patient was prescribed oral iron supplements and she takes fusion plus 1 tab daily for the past year.   She had hysrectomy in 2010, she had heavy menstrual period (lasts 30 days every a few months) for a few years before her surgery.   She feels well overall, has mild fatigue. She works full-time at [employer], tolerating her routine activities without difficult. She complains of mild numbness and tingling on fingers and toes, and some time skin color change (white) on fingers.   INTERIM HISTORY Pamela Best returns for follow-up and discuss her lab test results. She has been having episodic dry cough, no fever or sputum production. Has been treated for presumed acid reflux by her primary care physician. She was coughing quite a bit during her visit with me today. She otherwise denies any new symptoms, good energy level and eating well.   MEDICAL HISTORY:  Past Medical History  Diagnosis Date  . Depression     SURGICAL  HISTORY: Past Surgical History  Procedure Laterality Date  . Cholecystectomy    . Abdominal hysterectomy      SOCIAL HISTORY: History   Social History  . Marital Status: Divorced    Spouse Name: N/A  . Number of Children: 2, age of 63 and 27  . Years of Education: N/A   Occupational History  . She works for [employer]   Social History Main Topics  . Smoking status: Never Smoker   . Smokeless tobacco: Not on file  . Alcohol Use: Yes     Comment: socail drinker   . Drug Use: No  . Sexual Activity: Not on file   Other Topics Concern  . Not on file   Social History Narrative  . No narrative on file    FAMILY HISTORY: Family History  Problem Relation Age of Onset  . Hypothyroidism Mother   . Thalassemia Mother   . Cancer Father 83    prostate cancer   . Hypertension Sister   . Cancer Maternal Grandmother     breast cancer   . Cancer Maternal Grandfather     bone cancer    ALLERGIES:  has No Known Allergies.  MEDICATIONS:  Current Outpatient Prescriptions  Medication Sig Dispense Refill  . escitalopram (LEXAPRO) 20 MG tablet Take 20 mg by mouth daily.  2  . Iron-FA-B Cmp-C-Biot-Probiotic (FUSION PLUS) CAPS Take 1 capsule by mouth daily.    Marland Kitchen levocetirizine (XYZAL) 5 MG tablet Take 5 mg by mouth at bedtime.  5  . LIVIXIL PAK cream  Apply topically as needed.    Marland Kitchen omeprazole (PRILOSEC) 40 MG capsule Take 40 mg by mouth daily.  5  . valACYclovir (VALTREX) 500 MG tablet TAKE 1 TABLET BY MOUTH EVERY DAY 30 tablet PRN  . Vitamin D, Ergocalciferol, (DRISDOL) 50000 UNITS CAPS capsule TK ONE C PO TWICE WEEKLY  0   No current facility-administered medications for this visit.    REVIEW OF SYSTEMS:   Constitutional: Denies fevers, chills or abnormal night sweats Eyes: Denies blurriness of vision, double vision or watery eyes Ears, nose, mouth, throat, and face: Denies mucositis or sore throat Respiratory: Denies cough, dyspnea or wheezes Cardiovascular:  Denies palpitation, chest discomfort or lower extremity swelling Gastrointestinal:  Denies nausea, heartburn or change in bowel habits Skin: Denies abnormal skin rashes Lymphatics: Denies new lymphadenopathy or easy bruising Neurological:Denies numbness, tingling or new weaknesses Behavioral/Psych: Mood is stable, no new changes  All other systems were reviewed with the patient and are negative.  PHYSICAL EXAMINATION: ECOG PERFORMANCE STATUS: 0 - Asymptomatic  Filed Vitals:   10/05/14 0918  BP: 126/68  Pulse: 92  Temp: 98.9 F (37.2 C)  Resp: 18   Filed Weights   10/05/14 0918  Weight: 214 lb 1.6 oz (97.115 kg)    GENERAL:alert, no distress and comfortable SKIN: skin color, texture, turgor are normal, no rashes or significant lesions EYES: normal, conjunctiva are pink and non-injected, sclera clear OROPHARYNX:no exudate, no erythema and lips, buccal mucosa, and tongue normal  NECK: supple, thyroid normal size, non-tender, without nodularity LYMPH:  no palpable lymphadenopathy in the cervical, axillary or inguinal LUNGS: clear to auscultation and percussion with normal breathing effort HEART: regular rate & rhythm and no murmurs and no lower extremity edema ABDOMEN:abdomen soft, non-tender and normal bowel sounds Musculoskeletal:no cyanosis of digits and no clubbing  PSYCH: alert & oriented x 3 with fluent speech NEURO: no focal motor/sensory deficits  LABORATORY DATA:  I have reviewed the data as listed CBC Latest Ref Rng 09/04/2014 08/14/2008 08/13/2008  WBC 3.9 - 10.3 10e3/uL 6.4 8.3 10.0  Hemoglobin 11.6 - 15.9 g/dL 16.1 0.9(U) 9.3 DELTA CHECK NOTED(L)  Hematocrit 34.8 - 46.6 % 38.5 27.5(L) 29.4(L)  Platelets 145 - 400 10e3/uL 249 127 PLATELET COUNT CONFIRMED BY SMEAR(L) 148 DELTA CHECK NOTED(L)    CMP Latest Ref Rng 08/14/2008  Glucose 70 - 99 mg/dL 045(W)  BUN 6 - 23 mg/dL 4(L)  Creatinine 0.4 - 1.2 mg/dL 0.98  Sodium 119 - 147 mEq/L 140  Potassium 3.5 - 5.1 mEq/L  3.4(L)  Chloride 96 - 112 mEq/L 109  CO2 19 - 32 mEq/L 27  Calcium 8.4 - 10.5 mg/dL 8.7   Hemoglobinopathy evaluation  Status: Finalresult Visible to patient:  MyChart Nextappt: Today at 09:15 AM in Oncology Mosetta Putt, Terrace Arabia, MD) Dx:  Anemia, unspecified anemia type            Ref Range 46mo ago    Hgb A 96.8 - 97.8 % 91.1 (L)   Hgb A2 Quant 2.2 - 3.2 % 5.5 (H)   Hgb F Quant 0.0 - 2.0 % 3.4 (H)   Comments: Patient State         HbA2 Level     HbF Level-----------------------------------------------------------Heterozygous B-thalassemia    4-9%        1-5%Homozygous B-thalassemia  Normal or increased  80-100%Heterozygous HPFH        Less than 1.5%    10-20%Homozygous HPFH         Absent  100%     Hgb S Quant 0.0 % 0.0   Hemoglobin Other 0.0 % 0.0   Comments: Interpretation-------------- Elevated A2 levels can be seen in Beta Thalassemia Trait and a varietyof unstable hemoglobin variants. Elevations can also be seen inacquired conditions such as leukemias, megaloblastic anemia andhyperthyroidism. Accurate  interpretation of these results shouldinclude family history, hemoglobin, hematocrit, and mean corpuscularvolume results, red cell morphology, serum iron binding capacity,knowledge of recent transfusions and other pertinent clinicalfindings. Hb-F is  increased for the patient's reported age. Hb-A2 is of normalconcentration. No abnormal hemoglobin is identified. The elevated Hb-Fmay be due to an acquired increase with rapid regeneration oferythropoiesis from any cause. Alternatively, the elevated Hb-F  may be due to the hereditarypersistence of Hb-F, which is of no known physiologic significance. Reviewed by Dallas Breeding, MD, PhD, [facility] (Electronic Signature onFile)         Results for ADRIONA, KANEY (MRN [id number]) as of 10/05/2014 07:13  Ref. Range 09/04/2014 12:34  Iron Latest Ref Range: 41-142  ug/dL 41  UIBC Latest Ref Range: 120-384 ug/dL 409  TIBC Latest Ref Range: 236-444 ug/dL 811  %SAT Latest Ref Range: 21-57 % 11 (L)  Ferritin Latest Ref Range: 9-269 ng/ml 76    Her outside lab from 06/29/2014 reviewed CBC: Hemoglobin 11.9, MCV 66, WBC 5.7K, platelet 264K TSH 2.09, normal Vitamin B12: 412, normal CMP: Within normal limits except serum glucose 100 mg/dl   Labs from 91/47/8295: CBC: Hemoglobin 12.3, hematocrit 37.7, MCV 66, platelet 250 1K, WBC 6.4K Serum iron 42, U IBC 323, iron saturation 12% Ferritin 50, E 12 305, HbA1c 6.1%  RADIOGRAPHIC STUDIES: I have personally reviewed the radiological images as listed and agreed with the findings in the report. No results found.  ASSESSMENT & PLAN:  44 year old African-American female, with family history of thalassemia in her mother, presented with long-standing history of mild anemia.   1. Beta thalassemia trait, mild iron deficiency -She has a mild anemia with a hemoglobin around 12g/dl, with profound low MCV in mid 60 range. She has family history of thalassemia in her mother, and her hemoglobin electrophoresis showed elevated in the Hb A2 and F, consistent with beta thalassemia.  -Her our study showed normal ferritin and serum iron level, but slightly low iron saturation. She is taking over-the-counter iron pill once a day, I encouraged her to increase to 2 tablets a day. -Her methylmalonic level was low, against B12 deficiency -We reviewed beta thalassemia in general, she is likely a carrier, and will do well, no elevated reticular count, no significant hemoptysis. I suggest her to take multivitamin including folic acid and B12.  -She has to children, her daughter is mild anemic, I suggest her to check thalassemia anemia also.  2. Depression -Continue medication  3. Peripheral neuropathy -Unknown etiology. No lab evidence of B12 deficiency  4. Dry cough -I suggest her to follow-up with her primary care physician,  make sure have a chest x-ray to ruled out pulmonary disease -Is a possibility including acid reflux, or asthma  Follow-up: I'll see her back in 6 months with repeated CBC and iron study   All questions were answered. The patient knows to call the clinic with any problems, questions or concerns. I spent 20 minutes counseling the patient face to face. The total time spent in the appointment was 25 minutes and more than 50% was on counseling.     Malachy Mood, MD 10/05/2014 9:34 AM

## 2014-10-05 NOTE — Telephone Encounter (Signed)
per pof to sch pt appt-gave pt copy of avs °

## 2015-02-27 ENCOUNTER — Telehealth: Payer: Self-pay | Admitting: Hematology

## 2015-02-27 NOTE — Telephone Encounter (Signed)
Patient called to cx 1/5 and 1/12 appointments for lab/fu. 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

## 2015-02-28 ENCOUNTER — Other Ambulatory Visit: Payer: BC Managed Care – PPO

## 2015-03-07 ENCOUNTER — Ambulatory Visit: Payer: BC Managed Care – PPO | Admitting: Hematology

## 2015-09-06 ENCOUNTER — Other Ambulatory Visit: Payer: Self-pay | Admitting: Obstetrics and Gynecology

## 2015-09-06 DIAGNOSIS — R922 Inconclusive mammogram: Secondary | ICD-10-CM

## 2015-09-09 ENCOUNTER — Ambulatory Visit
Admission: RE | Admit: 2015-09-09 | Discharge: 2015-09-09 | Disposition: A | Discharge status: Home / Self Care | Payer: BC Managed Care – PPO | Source: Ambulatory Visit | Attending: Obstetrics and Gynecology | Admitting: Obstetrics and Gynecology

## 2015-09-09 DIAGNOSIS — R922 Inconclusive mammogram: Secondary | ICD-10-CM

## 2015-09-12 ENCOUNTER — Other Ambulatory Visit: Payer: BC Managed Care – PPO

## 2015-09-23 ENCOUNTER — Emergency Department (HOSPITAL_COMMUNITY): Payer: BC Managed Care – PPO

## 2015-09-23 ENCOUNTER — Emergency Department (HOSPITAL_COMMUNITY)
Admission: EM | Admit: 2015-09-23 | Discharge: 2015-09-24 | Disposition: A | Discharge status: Home / Self Care | Payer: BC Managed Care – PPO | Attending: Emergency Medicine | Admitting: Emergency Medicine

## 2015-09-23 ENCOUNTER — Encounter (HOSPITAL_COMMUNITY): Payer: Self-pay | Admitting: Emergency Medicine

## 2015-09-23 DIAGNOSIS — R519 Headache, unspecified: Secondary | ICD-10-CM

## 2015-09-23 DIAGNOSIS — Z79899 Other long term (current) drug therapy: Secondary | ICD-10-CM | POA: Insufficient documentation

## 2015-09-23 DIAGNOSIS — R51 Headache: Secondary | ICD-10-CM | POA: Diagnosis present

## 2015-09-23 DIAGNOSIS — M25562 Pain in left knee: Secondary | ICD-10-CM

## 2015-09-23 NOTE — ED Provider Notes (Signed)
Emergency Department Provider Note   I have reviewed the triage vital signs and the nursing notes.   HISTORY  Chief Complaint Headache and Knee Pain   HPI Pamela Best is a 45 y.o. female with PMH of depression presents to the emergency department with several months of intermittent right sided headache and left knee pain. The patient reports headaches occurring approximately 3 times per week. No preceding aura. Patient describes them as always being right temporal and throbbing in nature. No associated photophobia or phonophobia. Patient denies any associated fever or neck pain. She has not taken any medication for the headache stating that she "doesn't like taking pills." Typically the headaches resolve on their own without problems. She denies any weakness, numbness, vomiting during episodes.   Patient is also complaining of left knee pain that has been gradually worsening over the past several months. She describes pain located primarily anteriorly with no associated swelling. She denies new injury. No joint edema, redness, warmth. No fevers. She describes pain is worse when she is walking up stairs. Her job requires a lot of lifting.    Past Medical History:  Diagnosis Date  . Depression     There are no active problems to display for this patient.   Past Surgical History:  Procedure Laterality Date  . ABDOMINAL HYSTERECTOMY    . CHOLECYSTECTOMY      Current Outpatient Rx  . Order #: [id number] Class: Normal  . Order #: [id number] Class: Historical Med  . Order #: [id number] Class: Print    Allergies Review of patient's allergies indicates no known allergies.  Family History  Problem Relation Age of Onset  . Hypothyroidism Mother   . Thalassemia Mother   . Cancer Father 71    prostate cancer   . Hypertension Sister   . Cancer Maternal Grandmother     breast cancer   . Cancer Maternal Grandfather     bone cancer    Social History Social History  Substance  Use Topics  . Smoking status: Never Smoker  . Smokeless tobacco: Not on file  . Alcohol use Yes     Comment: socail drinker     Review of Systems  Constitutional: No fever/chills Eyes: No visual changes. ENT: No sore throat. Cardiovascular: Denies chest pain. Respiratory: Denies shortness of breath. Gastrointestinal: No abdominal pain.  No nausea, no vomiting.  No diarrhea.  No constipation. Genitourinary: Negative for dysuria. Musculoskeletal: Negative for back pain. Positive right knee pain.  Skin: Negative for rash. Neurological: Negative for focal weakness or numbness. Positive HA.   10-point ROS otherwise negative.  ____________________________________________   PHYSICAL EXAM:  VITAL SIGNS: ED Triage Vitals  Enc Vitals Group     BP 09/23/15 1939 153/79     Pulse Rate 09/23/15 1939 98     Resp 09/23/15 1939 20     Temp 09/23/15 1939 98.5 F (36.9 C)     Temp Source 09/23/15 1939 Oral     SpO2 09/23/15 1939 96 %     Pain Score 09/23/15 1941 9   Constitutional: Alert and oriented. Well appearing and in no acute distress. Eyes: Conjunctivae are normal. PERRL. EOMI. Head: Atraumatic. Nose: No congestion/rhinnorhea. Mouth/Throat: Mucous membranes are moist.  Oropharynx non-erythematous. Neck: No stridor.  No meningeal signs. Cardiovascular: Normal rate, regular rhythm. Good peripheral circulation. Grossly normal heart sounds.   Respiratory: Normal respiratory effort.  No retractions. Lungs CTAB. Gastrointestinal: Soft and nontender. No distention.  Musculoskeletal: No lower extremity tenderness nor edema. No  gross deformities of extremities. Full ROM of the left knee with mild anterior tenderness to palpation. No erythema or warmth.  Neurologic:  Normal speech and language. No gross focal neurologic deficits are appreciated.  Skin:  Skin is warm, dry and intact. No rash noted. Psychiatric: Mood and affect are normal. Speech and behavior are  normal.  ____________________________________________  RADIOLOGY  Dg Knee Complete 4 Views Left  Result Date: 09/23/2015 CLINICAL DATA:  45 year old female with chronic left knee pain and a known injury. Medial pain near the patella. Initial encounter. EXAM: LEFT KNEE - COMPLETE 4+ VIEW COMPARISON:  None. FINDINGS: Borderline to mild medial compartment joint space loss and degenerative spurring. Joint spaces and alignment otherwise preserved. Patella intact. Bone mineralization is within normal limits. No joint effusion. No acute osseous abnormality identified. IMPRESSION: Borderline to mild medial compartment degeneration. No acute osseous abnormality identified. Electronically Signed   By: Odessa Fleming M.D.   On: 09/23/2015 20:24    ____________________________________________   PROCEDURES  Procedure(s) performed:   Procedures  None ____________________________________________   INITIAL IMPRESSION / ASSESSMENT AND PLAN / ED COURSE  Pertinent labs & imaging results that were available during my care of the patient were reviewed by me and considered in my medical decision making (see chart for details).  Patient presents to the emergency department for evaluation of acute on chronic headache and left knee pain. Symptoms ongoing for many months. Patient is not having a headache currently. No temporal artery tenderness to palpation. Normal vision grossly. No focal neurological deficits to assist take further advanced imaging or other testing. Discussed keeping a headache diary and discussing patient's symptoms with her primary care physician. Will give contact information for a neurologist.  Patient's knee shows no appreciable edema. X-ray performed from triage is with no bony abnormalities. Patient has full range of motion with no evidence of septic arthritis. Suspect possible meniscal injury versus early arthritis. Discussed that she should take Motrin and ice/wrap the knee as needed.  Discussed that she may need follow-up with orthopedic surgery and she discussed this with her primary care physician if conservative measures fail.   ____________________________________________  FINAL CLINICAL IMPRESSION(S) / ED DIAGNOSES  Final diagnoses:  Left knee pain  Nonintractable headache, unspecified chronicity pattern, unspecified headache type     MEDICATIONS GIVEN DURING THIS VISIT:  None  NEW OUTPATIENT MEDICATIONS STARTED DURING THIS VISIT:  Discharge Medication List as of 09/24/2015 12:05 AM    START taking these medications   Details  ibuprofen (ADVIL,MOTRIN) 800 MG tablet Take 1 tablet (800 mg total) by mouth 3 (three) times daily., Starting Tue 09/24/2015, Print          Note:  This document was prepared using Dragon voice recognition software and may include unintentional dictation errors.  Alona Bene, MD Emergency Medicine   Maia Plan, MD 09/24/15 [phone]

## 2015-09-23 NOTE — ED Triage Notes (Signed)
Pt states that she has been having L knee pain and R sided headache for 'several months' States that the kene hurts more when she gets up after a long time and the headache is not light/sound sensitive. Alert and oriented.

## 2015-09-24 MED ORDER — IBUPROFEN 800 MG PO TABS: 800.0000 mg | ORAL_TABLET | Freq: Three times a day (TID) | ORAL | 0 refills | Status: DC

## 2015-09-24 NOTE — Discharge Instructions (Signed)

## 2016-09-03 IMAGING — CR DG KNEE COMPLETE 4+V*L*
4 series · 4 of 4 positions shown · non-contrast
Comparison: None.

CLINICAL DATA: 45-year-old female with chronic left knee pain and a
known injury. Medial pain near the patella. Initial encounter.

EXAM:
LEFT KNEE - COMPLETE 4+ VIEW

[t knee ap left]
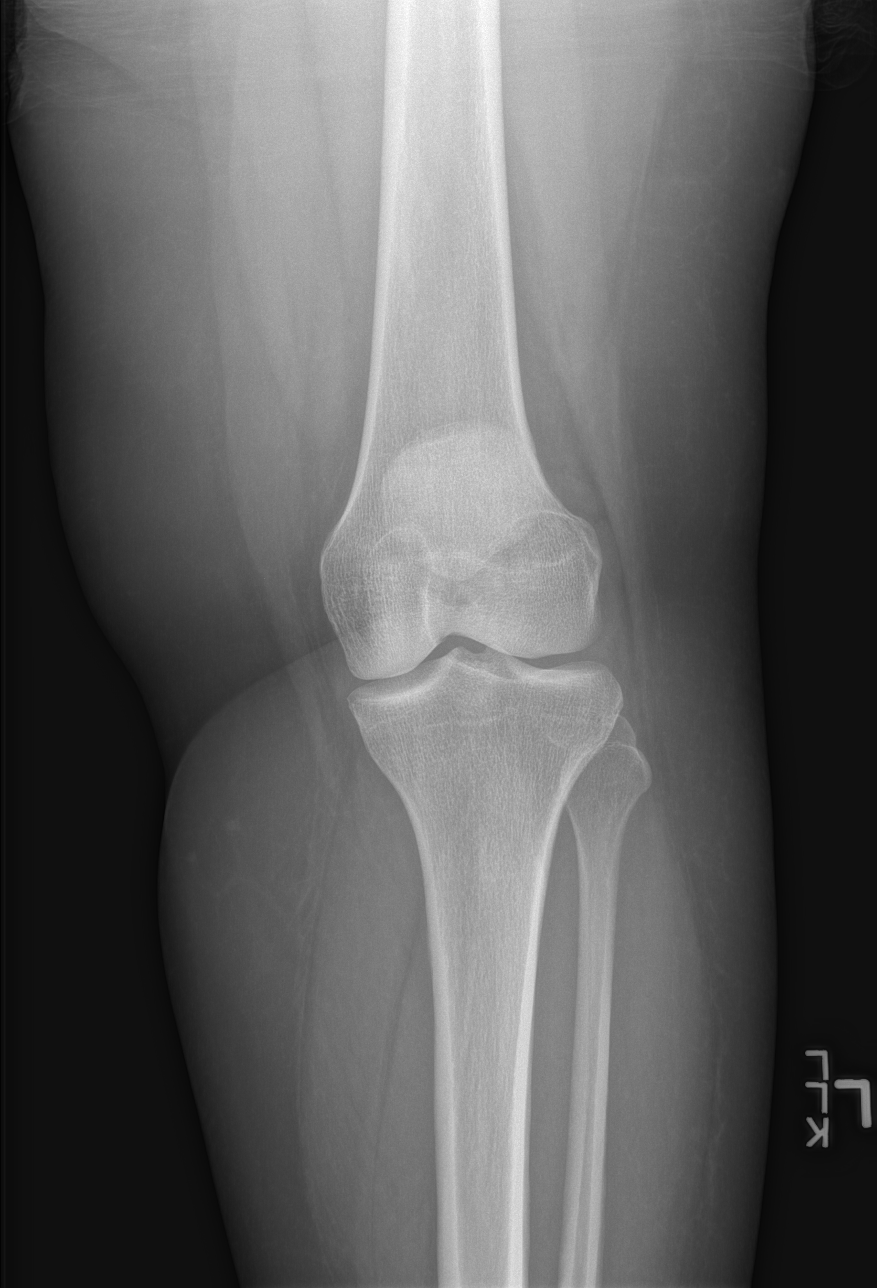

[t knee obl left (1 of 2)]
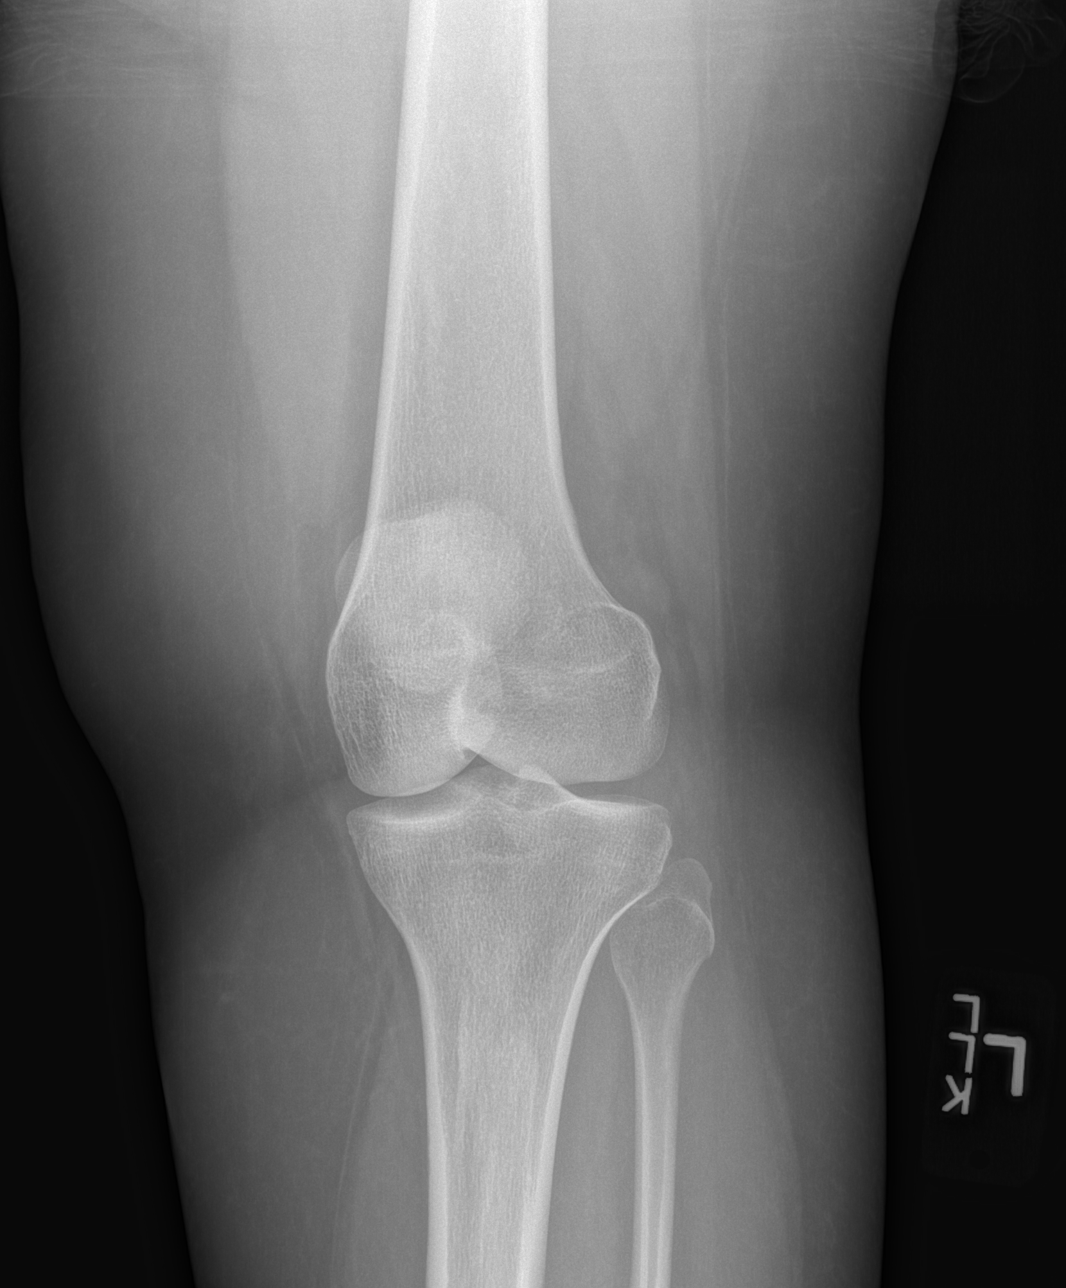

[t knee obl left (2 of 2)]
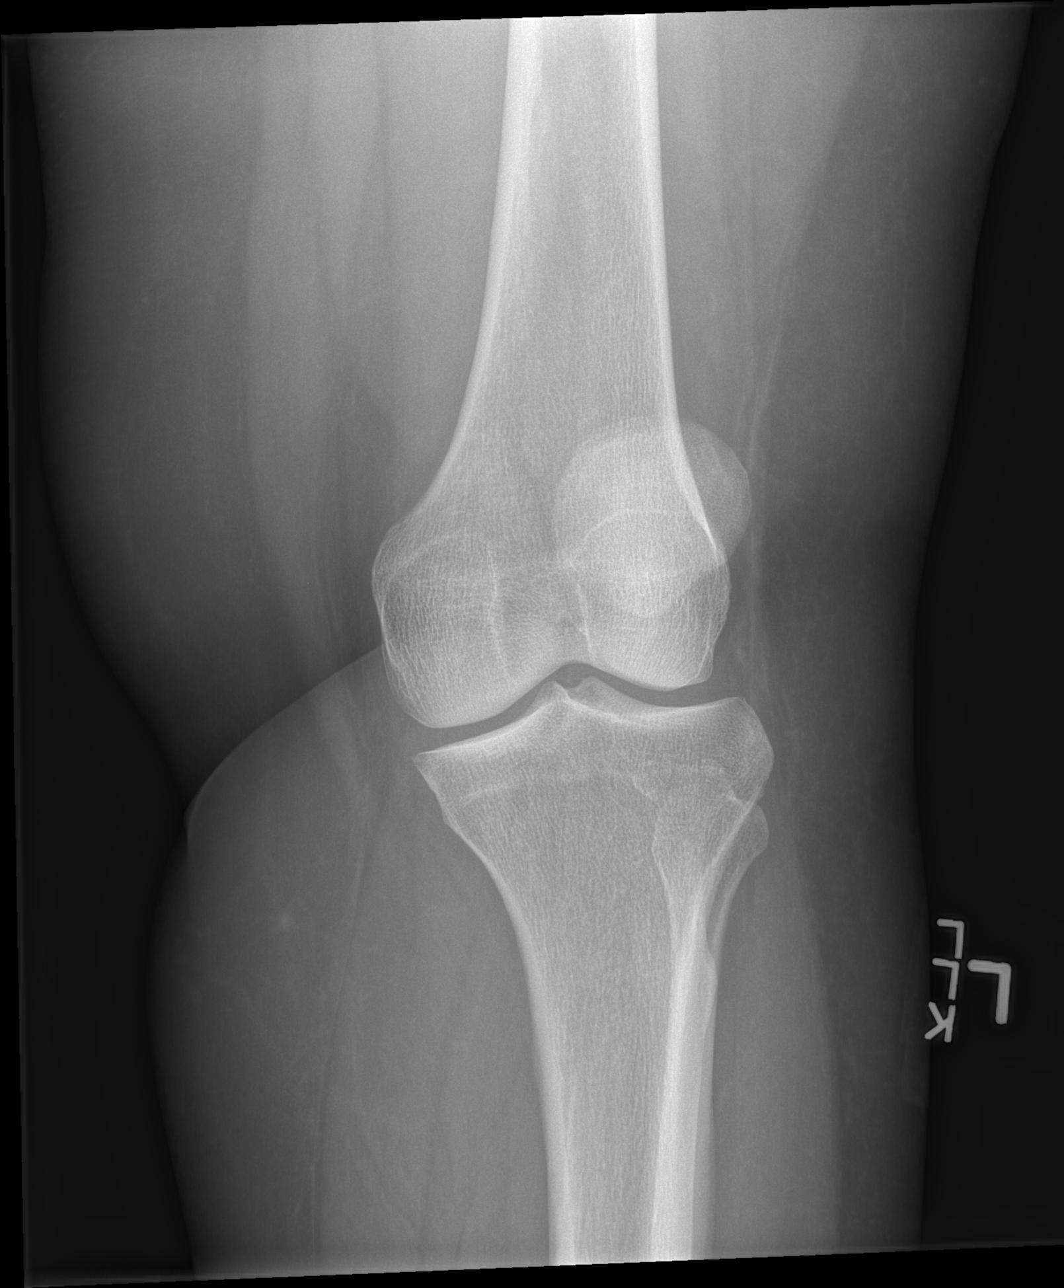

[t knee lat left]
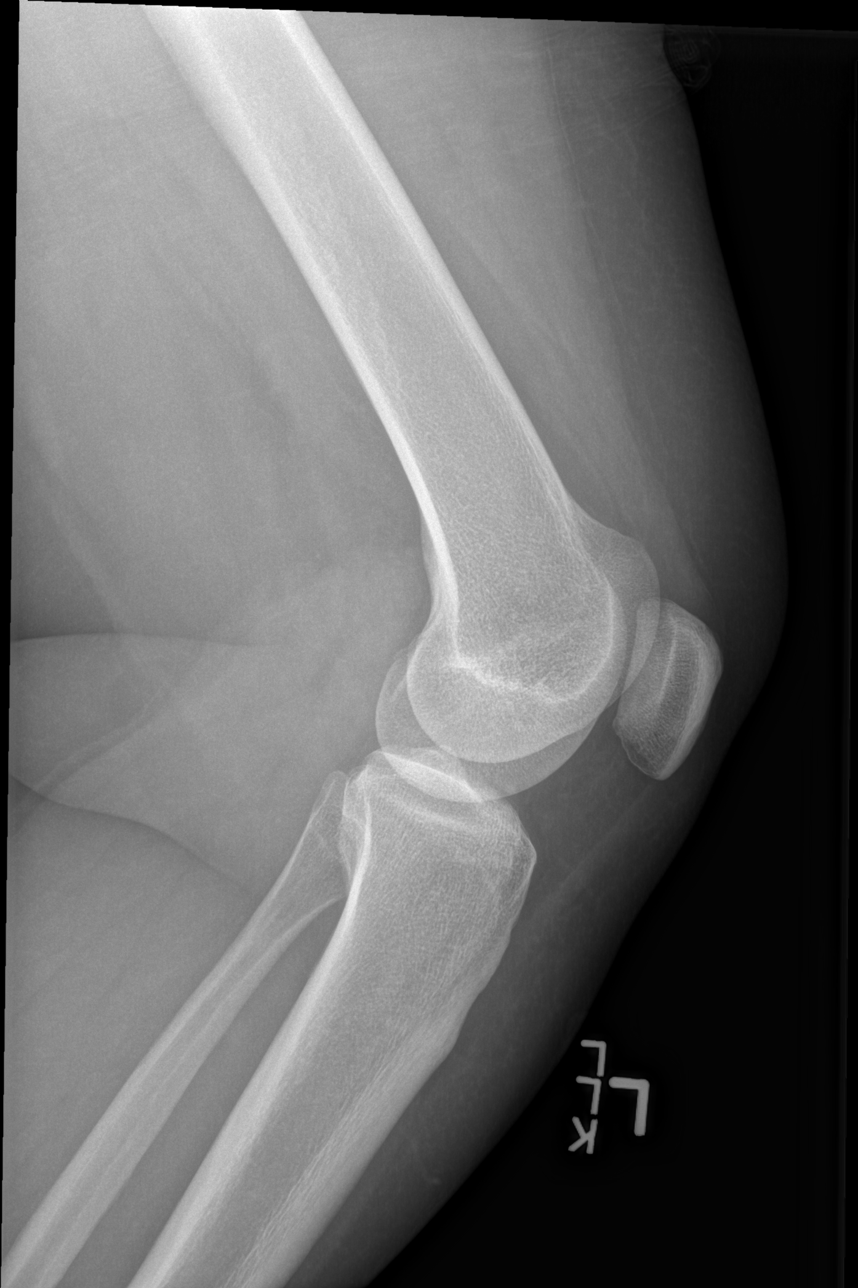

[4 of 4 positions shown; findings below may reference images not displayed]

FINDINGS: Borderline to mild medial compartment joint space loss and
degenerative spurring. Joint spaces and alignment otherwise
preserved. Patella intact. Bone mineralization is within normal
limits. No joint effusion. No acute osseous abnormality identified.
IMPRESSION: Borderline to mild medial compartment degeneration. No acute osseous
abnormality identified.

## 2019-04-22 ENCOUNTER — Ambulatory Visit: Payer: BC Managed Care – PPO | Attending: Internal Medicine

## 2019-04-22 DIAGNOSIS — Z23 Encounter for immunization: Secondary | ICD-10-CM | POA: Insufficient documentation

## 2019-04-22 NOTE — Progress Notes (Signed)
   Covid-19 Vaccination Clinic  Name:  Pamela Best    MRN: [id number] DOB: [birth year]  04/22/2019  Pamela Best was observed post Covid-19 immunization for 15 minutes without incidence. She was provided with Vaccine Information Sheet and instruction to access the V-Safe system.   Pamela Best was instructed to call 911 with any severe reactions post vaccine: Marland Kitchen Difficulty breathing  . Swelling of your face and throat  . A fast heartbeat  . A bad rash all over your body  . Dizziness and weakness    Immunizations Administered    Name Date Dose VIS Date Route   Pfizer COVID-19 Vaccine 04/22/2019  6:01 PM 0.3 mL 02/03/2019 Intramuscular   Manufacturer: [employer], [employer]   Lot: [id number]   NDC: [address]-1

## 2019-04-26 ENCOUNTER — Encounter: Payer: Self-pay | Admitting: Obstetrics and Gynecology

## 2019-05-13 ENCOUNTER — Ambulatory Visit: Payer: BC Managed Care – PPO | Attending: Internal Medicine

## 2019-05-13 DIAGNOSIS — Z23 Encounter for immunization: Secondary | ICD-10-CM

## 2019-05-13 NOTE — Progress Notes (Signed)
   Covid-19 Vaccination Clinic  Name:  Pamela Best    MRN: [id number] DOB: [birth year]  05/13/2019  [name] was observed post Covid-19 immunization for 15 minutes without incident. She was provided with Vaccine Information Sheet and instruction to access the V-Safe system.   [name] was instructed to call 911 with any severe reactions post vaccine: Marland Kitchen Difficulty breathing  . Swelling of face and throat  . A fast heartbeat  . A bad rash all over body  . Dizziness and weakness   Immunizations Administered    Name Date Dose VIS Date Route   Pfizer COVID-19 Vaccine 05/13/2019 11:19 AM 0.3 mL 02/03/2019 Intramuscular   Manufacturer: [employer], [employer]   Lot: [id number]   NDC: [address]-9

## 2019-06-20 ENCOUNTER — Other Ambulatory Visit: Payer: Self-pay | Admitting: Obstetrics & Gynecology

## 2019-08-03 ENCOUNTER — Encounter (HOSPITAL_BASED_OUTPATIENT_CLINIC_OR_DEPARTMENT_OTHER): Payer: Self-pay | Admitting: Obstetrics & Gynecology

## 2019-08-03 ENCOUNTER — Other Ambulatory Visit: Payer: Self-pay

## 2019-08-03 NOTE — Progress Notes (Signed)
Spoke w/ via phone for pre-op interview---patient Lab needs dos----none              COVID test ------08-07-2019 at 920 am Arrive at -------1030 am 08-10-2019 NPO after ------midnight Medications to take morning of surgery -----escitalopram Diabetic medication -----n/a Patient Special Instructions -----none Pre-Op special Istructions -----none Patient verbalized understanding of instructions that were given at this phone interview. Patient denies shortness of breath, chest pain, fever, cough a this phone interview.

## 2019-08-07 ENCOUNTER — Other Ambulatory Visit (HOSPITAL_COMMUNITY)
Admission: RE | Admit: 2019-08-07 | Discharge: 2019-08-07 | Disposition: A | Discharge status: Home / Self Care | Payer: BC Managed Care – PPO | Source: Ambulatory Visit | Attending: Obstetrics & Gynecology | Admitting: Obstetrics & Gynecology

## 2019-08-07 DIAGNOSIS — Z01812 Encounter for preprocedural laboratory examination: Secondary | ICD-10-CM | POA: Insufficient documentation

## 2019-08-07 DIAGNOSIS — Z20822 Contact with and (suspected) exposure to covid-19: Secondary | ICD-10-CM | POA: Diagnosis not present

## 2019-08-07 LAB — SARS CORONAVIRUS 2 (TAT 6-24 HRS): SARS Coronavirus 2: NEGATIVE

## 2019-08-10 ENCOUNTER — Ambulatory Visit (HOSPITAL_BASED_OUTPATIENT_CLINIC_OR_DEPARTMENT_OTHER): Payer: BC Managed Care – PPO | Admitting: Anesthesiology

## 2019-08-10 ENCOUNTER — Encounter (HOSPITAL_BASED_OUTPATIENT_CLINIC_OR_DEPARTMENT_OTHER)
Admission: RE | Disposition: A | Discharge status: Home / Self Care | Payer: Self-pay | Source: Home / Self Care | Attending: Obstetrics & Gynecology

## 2019-08-10 ENCOUNTER — Ambulatory Visit (HOSPITAL_BASED_OUTPATIENT_CLINIC_OR_DEPARTMENT_OTHER)
Admission: RE | Admit: 2019-08-10 | Discharge: 2019-08-10 | Disposition: A | Discharge status: Home / Self Care | Payer: BC Managed Care – PPO | Attending: Obstetrics & Gynecology | Admitting: Obstetrics & Gynecology

## 2019-08-10 ENCOUNTER — Encounter (HOSPITAL_BASED_OUTPATIENT_CLINIC_OR_DEPARTMENT_OTHER): Payer: Self-pay | Admitting: Obstetrics & Gynecology

## 2019-08-10 DIAGNOSIS — N393 Stress incontinence (female) (male): Secondary | ICD-10-CM | POA: Diagnosis not present

## 2019-08-10 DIAGNOSIS — Z79899 Other long term (current) drug therapy: Secondary | ICD-10-CM | POA: Diagnosis not present

## 2019-08-10 DIAGNOSIS — F329 Major depressive disorder, single episode, unspecified: Secondary | ICD-10-CM | POA: Insufficient documentation

## 2019-08-10 HISTORY — DX: Insomnia, unspecified: G47.00

## 2019-08-10 HISTORY — DX: Personal history of other diseases of the digestive system: Z87.19

## 2019-08-10 HISTORY — DX: Headache, unspecified: R51.9

## 2019-08-10 HISTORY — PX: BLADDER SUSPENSION: SHX72

## 2019-08-10 HISTORY — PX: CYSTOSCOPY: SHX5120

## 2019-08-10 HISTORY — DX: Stress incontinence (female) (male): N39.3

## 2019-08-10 SURGERY — URETHROPEXY, USING TRANSVAGINAL TAPE
Anesthesia: General

## 2019-08-10 MED ORDER — KETAMINE HCL 10 MG/ML IJ SOLN
INTRAMUSCULAR | Status: DC | PRN
Start: 2019-08-10 — End: 2019-08-10
  Administered 2019-08-10: 30 mg via INTRAVENOUS

## 2019-08-10 MED ORDER — OXYCODONE-ACETAMINOPHEN 5-325 MG PO TABS: ORAL_TABLET | ORAL | 0 refills | Status: AC

## 2019-08-10 MED ORDER — ONDANSETRON HCL 4 MG/2ML IJ SOLN
INTRAMUSCULAR | Status: AC
  Filled 2019-08-10: qty 2

## 2019-08-10 MED ORDER — MEPERIDINE HCL 25 MG/ML IJ SOLN: 6.2500 mg | INTRAMUSCULAR | Status: DC | PRN

## 2019-08-10 MED ORDER — PROPOFOL 10 MG/ML IV BOLUS
INTRAVENOUS | Status: DC | PRN
  Administered 2019-08-10: 180 mg via INTRAVENOUS

## 2019-08-10 MED ORDER — DEXAMETHASONE SODIUM PHOSPHATE 10 MG/ML IJ SOLN
INTRAMUSCULAR | Status: DC | PRN
  Administered 2019-08-10: 10 mg via INTRAVENOUS

## 2019-08-10 MED ORDER — FENTANYL CITRATE (PF) 100 MCG/2ML IJ SOLN
INTRAMUSCULAR | Status: AC
  Filled 2019-08-10: qty 2

## 2019-08-10 MED ORDER — ONDANSETRON HCL 4 MG/2ML IJ SOLN
INTRAMUSCULAR | Status: DC | PRN
  Administered 2019-08-10: 4 mg via INTRAVENOUS

## 2019-08-10 MED ORDER — OXYCODONE-ACETAMINOPHEN 5-325 MG PO TABS: 1.0000 | ORAL_TABLET | ORAL | Status: DC | PRN

## 2019-08-10 MED ORDER — FENTANYL CITRATE (PF) 100 MCG/2ML IJ SOLN
INTRAMUSCULAR | Status: DC | PRN
  Administered 2019-08-10 (×2): 25 ug via INTRAVENOUS
  Administered 2019-08-10: 50 ug via INTRAVENOUS

## 2019-08-10 MED ORDER — OXYCODONE HCL 5 MG/5ML PO SOLN: 5.0000 mg | Freq: Once | ORAL | Status: DC | PRN

## 2019-08-10 MED ORDER — MIDAZOLAM HCL 2 MG/2ML IJ SOLN
INTRAMUSCULAR | Status: AC
  Filled 2019-08-10: qty 2

## 2019-08-10 MED ORDER — MIDAZOLAM HCL 5 MG/5ML IJ SOLN
INTRAMUSCULAR | Status: DC | PRN
  Administered 2019-08-10: 2 mg via INTRAVENOUS

## 2019-08-10 MED ORDER — AMISULPRIDE (ANTIEMETIC) 5 MG/2ML IV SOLN
10.0000 mg | Freq: Once | INTRAVENOUS | Status: AC
  Administered 2019-08-10: 10 mg via INTRAVENOUS

## 2019-08-10 MED ORDER — OXYCODONE HCL 5 MG PO TABS: 5.0000 mg | ORAL_TABLET | Freq: Once | ORAL | Status: DC | PRN

## 2019-08-10 MED ORDER — ACETAMINOPHEN 325 MG PO TABS: 325.0000 mg | ORAL_TABLET | ORAL | Status: DC | PRN

## 2019-08-10 MED ORDER — CEFAZOLIN SODIUM-DEXTROSE 2-3 GM-%(50ML) IV SOLR
INTRAVENOUS | Status: DC | PRN
Start: 2019-08-10 — End: 2019-08-10
  Administered 2019-08-10: 2 g via INTRAVENOUS

## 2019-08-10 MED ORDER — ONDANSETRON HCL 4 MG/2ML IJ SOLN
4.0000 mg | Freq: Once | INTRAMUSCULAR | Status: AC | PRN
  Administered 2019-08-10: 4 mg via INTRAVENOUS

## 2019-08-10 MED ORDER — ACETAMINOPHEN 160 MG/5ML PO SOLN: 325.0000 mg | ORAL | Status: DC | PRN

## 2019-08-10 MED ORDER — AMISULPRIDE (ANTIEMETIC) 5 MG/2ML IV SOLN
INTRAVENOUS | Status: AC
  Filled 2019-08-10: qty 2

## 2019-08-10 MED ORDER — LIDOCAINE HCL URETHRAL/MUCOSAL 2 % EX GEL
CUTANEOUS | Status: DC | PRN
  Administered 2019-08-10: 1 via TOPICAL

## 2019-08-10 MED ORDER — LIDOCAINE 2% (20 MG/ML) 5 ML SYRINGE
INTRAMUSCULAR | Status: DC | PRN
  Administered 2019-08-10: 80 mg via INTRAVENOUS

## 2019-08-10 MED ORDER — FENTANYL CITRATE (PF) 100 MCG/2ML IJ SOLN: 25.0000 ug | INTRAMUSCULAR | Status: DC | PRN

## 2019-08-10 MED ORDER — DEXAMETHASONE SODIUM PHOSPHATE 10 MG/ML IJ SOLN
INTRAMUSCULAR | Status: AC
  Filled 2019-08-10: qty 1

## 2019-08-10 MED ORDER — BUPIVACAINE HCL (PF) 0.25 % IJ SOLN
INTRAMUSCULAR | Status: DC | PRN
  Administered 2019-08-10: 30 mL

## 2019-08-10 MED ORDER — KETOROLAC TROMETHAMINE 30 MG/ML IJ SOLN
INTRAMUSCULAR | Status: DC | PRN
  Administered 2019-08-10: 30 mg via INTRAVENOUS

## 2019-08-10 MED ORDER — LIDOCAINE 2% (20 MG/ML) 5 ML SYRINGE
INTRAMUSCULAR | Status: AC
  Filled 2019-08-10: qty 5

## 2019-08-10 MED ORDER — LACTATED RINGERS IV SOLN: INTRAVENOUS | Status: DC

## 2019-08-10 MED ORDER — IBUPROFEN 600 MG PO TABS
ORAL_TABLET | ORAL | 0 refills | Status: AC
Start: 2019-08-10 — End: ?

## 2019-08-10 MED ORDER — CEFAZOLIN SODIUM-DEXTROSE 2-4 GM/100ML-% IV SOLN
INTRAVENOUS | Status: AC
  Filled 2019-08-10: qty 100

## 2019-08-10 MED ORDER — LIDOCAINE-EPINEPHRINE 1 %-1:100000 IJ SOLN
INTRAMUSCULAR | Status: DC | PRN
  Administered 2019-08-10: 16.5 mL

## 2019-08-10 SURGICAL SUPPLY — 35 items
ADH SKN CLS APL DERMABOND .7 (GAUZE/BANDAGES/DRESSINGS) ×2
AGENT HMST KT MTR STRL THRMB (HEMOSTASIS)
BLADE SURG 11 STRL SS (BLADE) ×3 IMPLANT
BLADE SURG 15 STRL LF DISP TIS (BLADE) ×2 IMPLANT
BLADE SURG 15 STRL SS (BLADE) ×3
CANISTER SUCT 3000ML PPV (MISCELLANEOUS) ×3 IMPLANT
CATH FOLEY 2WAY SLVR 18FR 30CC (CATHETERS) ×2 IMPLANT
COVER WAND RF STERILE (DRAPES) ×3 IMPLANT
DECANTER SPIKE VIAL GLASS SM (MISCELLANEOUS) ×3 IMPLANT
DERMABOND ADVANCED (GAUZE/BANDAGES/DRESSINGS) ×1
DERMABOND ADVANCED .7 DNX12 (GAUZE/BANDAGES/DRESSINGS) ×2 IMPLANT
GAUZE PACKING 2X5 YD STRL (GAUZE/BANDAGES/DRESSINGS) IMPLANT
GLOVE BIO SURGEON STRL SZ 6.5 (GLOVE) ×3 IMPLANT
GLOVE BIOGEL PI IND STRL 6.5 (GLOVE) IMPLANT
GLOVE BIOGEL PI IND STRL 7.0 (GLOVE) ×4 IMPLANT
GLOVE BIOGEL PI IND STRL 7.5 (GLOVE) IMPLANT
GLOVE BIOGEL PI INDICATOR 6.5 (GLOVE) ×1
GLOVE BIOGEL PI INDICATOR 7.0 (GLOVE) ×2
GLOVE BIOGEL PI INDICATOR 7.5 (GLOVE) ×3
GLOVE ECLIPSE 6.5 STRL STRAW (GLOVE) ×1 IMPLANT
GOWN STRL REUS W/TWL LRG LVL3 (GOWN DISPOSABLE) ×6 IMPLANT
KIT TURNOVER CYSTO (KITS) ×3 IMPLANT
NDL SPNL 22GX3.5 QUINCKE BK (NEEDLE) ×2 IMPLANT
NEEDLE SPNL 22GX3.5 QUINCKE BK (NEEDLE) ×3 IMPLANT
NS IRRIG 1000ML POUR BTL (IV SOLUTION) ×3 IMPLANT
PACK VAGINAL WOMENS (CUSTOM PROCEDURE TRAY) ×3 IMPLANT
SET IRRIG Y TYPE TUR BLADDER L (SET/KITS/TRAYS/PACK) ×3 IMPLANT
SLING TVT EXACT (Sling) ×3 IMPLANT
SURGIFLO W/THROMBIN 8M KIT (HEMOSTASIS) IMPLANT
SUT MNCRL AB 3-0 PS2 27 (SUTURE) ×3 IMPLANT
SUT VIC AB 0 CT1 18XCR BRD8 (SUTURE) ×2 IMPLANT
SUT VIC AB 0 CT1 8-18 (SUTURE)
SUT VIC AB 2-0 UR6 27 (SUTURE) IMPLANT
TOWEL OR 17X26 10 PK STRL BLUE (TOWEL DISPOSABLE) ×5 IMPLANT
TRAY FOLEY W/BAG SLVR 14FR (SET/KITS/TRAYS/PACK) ×3 IMPLANT

## 2019-08-10 NOTE — Anesthesia Procedure Notes (Signed)
Procedure Name: LMA Insertion Date/Time: 08/10/2019 12:59 PM Performed by: Jalilah Wiltsie D, CRNA Pre-anesthesia Checklist: Patient identified, Emergency Drugs available, Suction available and Patient being monitored Patient Re-evaluated:Patient Re-evaluated prior to induction Oxygen Delivery Method: Circle system utilized Preoxygenation: Pre-oxygenation with 100% oxygen Induction Type: IV induction Ventilation: Mask ventilation without difficulty LMA: LMA inserted LMA Size: 4.0 Tube type: Oral Number of attempts: 1 Placement Confirmation: positive ETCO2 and breath sounds checked- equal and bilateral Tube secured with: Tape Dental Injury: Teeth and Oropharynx as per pre-operative assessment

## 2019-08-10 NOTE — H&P (Signed)
Pamela Best is an 49 y.o. female with urinary stress incontinence. Urodynamic studies confirm that it is USI.    Pertinent Gynecological History: Menses: post-menopausal Bleeding: none Contraception: status post hysterectomy DES exposure: denies Blood transfusions: none Sexually transmitted diseases: no past history Previous GYN Procedures: hysterectomy  Last mammogram: normal Date: 2021 Last pap: normal Date: 2011 (pre-hysterectomy) OB History: G3, P2   Menstrual History: Menarche age: 35 No LMP recorded. Patient has had a hysterectomy.    Past Medical History:  Diagnosis Date  . Chronic headache   . Depression   . History of gastroesophageal reflux (GERD)   . Insomnia   . SUI (stress urinary incontinence, female)     Past Surgical History:  Procedure Laterality Date  . ABDOMINAL HYSTERECTOMY  yrs ago   partial  . CHOLECYSTECTOMY  yrs ago  . TUBAL LIGATION  yrs ago    Family History  Problem Relation Age of Onset  . Hypothyroidism Mother   . Thalassemia Mother   . Cancer Father 51       prostate cancer   . Hypertension Sister   . Cancer Maternal Grandmother        breast cancer   . Cancer Maternal Grandfather        bone cancer    Social History:  reports that she has never smoked. She has never used smokeless tobacco. She reports previous alcohol use. She reports that she does not use drugs.  Allergies: No Known Allergies  Medications Prior to Admission  Medication Sig Dispense Refill Last Dose  . valACYclovir (VALTREX) 500 MG tablet TAKE 1 TABLET BY MOUTH EVERY DAY (Patient taking differently: TAKE 1 TABLET BY MOUTH EVERY DAY AS NEEDED FOR OUTBREAKS) 30 tablet PRN 08/09/2019 at Unknown time  . escitalopram (LEXAPRO) 20 MG tablet Take 20 mg by mouth daily as needed (depression).   2 More than a month at Unknown time    Review of Systems  Blood pressure (!) 150/95, pulse 95, temperature 98.4 F (36.9 C), temperature source Oral, resp. rate 18, height  5\' 2"  (1.575 m), weight 100.7 kg, SpO2 100 %. Physical Exam  Deferred to OR  No results found for this or any previous visit (from the past 24 hour(s)).  No results found.  Assessment/Plan: 49 year old with stress urinary incontinence by history, demonstrated on office cystometric testing.44     Options were discussed with patient to include no intervention, behavioral modification to avoid full bladder in conjunction with physical therapy of pelvic floor, pessary, mid-urethral sling placement, or Burch procedure.   Pt desires an anti-incontinence mid-urethral sling procedure.    Risks of the TVT procedure discussed including but not limited to infection/bleeding, persistent urinary stress incontinence, worsening of de novo urge incontinence, urinary retention requiring postoperative self-catheterization/foley placement and even return to OR to release sling, injury to bladder or urethra necessitating further surgery, and mesh erosion 5% risk. Pt advised that long-term success rates (after 5 years) are about 85%. Pt understands the risks and agrees to proceed. Consent signed, witnessed and placed into chart.    Pamela Best 08/10/2019, 12:22 PM

## 2019-08-10 NOTE — Discharge Instructions (Signed)
Call [facility] @ [phone] if:  You have a temperature greater than or equal to 100.4 degrees Farenheit orally You have pain that is not made better by the pain medication given and taken as directed You have excessive bleeding or problems urinating  Take Colace (Docusate Sodium/Stool Softener) 100 mg 2-3 times daily while taking narcotic pain medicine to avoid constipation or until bowel movements are regular. Take Ibuprofen 600 mg with food, every 6 hours for 5 days then as needed for  pain (take your first dose at 7:30 p.m. on day of surgery)  You may drive after 1 week You may walk up steps  You may shower tomorrow You may resume a regular diet  Keep incisions clean and dry Do not lift over 15 pounds for 6 weeks Avoid anything in vagina for 6 weeks     Post Anesthesia Home Care Instructions  Activity: Get plenty of rest for the remainder of the day. A responsible individual must stay with you for 24 hours following the procedure.  For the next 24 hours, DO NOT: -Drive a car -Advertising copywriter -Drink alcoholic beverages -Take any medication unless instructed by your physician -Make any legal decisions or sign important papers.  Meals: Start with liquid foods such as gelatin or soup. Progress to regular foods as tolerated. Avoid greasy, spicy, heavy foods. If nausea and/or vomiting occur, drink only clear liquids until the nausea and/or vomiting subsides. Call your physician if vomiting continues.  Special Instructions/Symptoms: Your throat may feel dry or sore from the anesthesia or the breathing tube placed in your throat during surgery. If this causes discomfort, gargle with warm salt water. The discomfort should disappear within 24 hours.

## 2019-08-10 NOTE — Anesthesia Preprocedure Evaluation (Signed)
Anesthesia Evaluation  Patient identified by MRN, date of birth, ID band Patient awake    Reviewed: Allergy & Precautions, H&P , NPO status , Patient's Chart, lab work & pertinent test results, reviewed documented beta blocker date and time   Airway Mallampati: II  TM Distance: >3 FB Neck ROM: full    Dental no notable dental hx.    Pulmonary neg pulmonary ROS,    Pulmonary exam normal breath sounds clear to auscultation       Cardiovascular Exercise Tolerance: Good negative cardio ROS   Rhythm:regular Rate:Normal     Neuro/Psych  Headaches, PSYCHIATRIC DISORDERS Depression    GI/Hepatic Neg liver ROS, GERD  ,  Endo/Other  negative endocrine ROS  Renal/GU negative Renal ROS  negative genitourinary   Musculoskeletal   Abdominal   Peds  Hematology negative hematology ROS (+)   Anesthesia Other Findings   Reproductive/Obstetrics negative OB ROS                             Anesthesia Physical Anesthesia Plan  ASA: II  Anesthesia Plan: General   Post-op Pain Management:    Induction: Intravenous  PONV Risk Score and Plan:   Airway Management Planned: LMA and Oral ETT  Additional Equipment:   Intra-op Plan:   Post-operative Plan:   Informed Consent: I have reviewed the patients History and Physical, chart, labs and discussed the procedure including the risks, benefits and alternatives for the proposed anesthesia with the patient or authorized representative who has indicated his/her understanding and acceptance.     Dental Advisory Given  Plan Discussed with: CRNA, Anesthesiologist and Surgeon  Anesthesia Plan Comments: ( )        Anesthesia Quick Evaluation

## 2019-08-10 NOTE — Op Note (Signed)
GEANETTE BUONOCORE female 49 y.o. 08/10/2019      Indications: The patient is a 49yo AAF with withurinary stress incontinence as per her clinical history and as demonstrated by office cystometrics. Risks, benefits, alternatives to surgery discussed with patient.    Surgeon: Caffie Damme MD  Assistants: Earnstine Regal, PA  Anesthesia: General LMA anesthesia and Local anesthesia 1% plain lidocaine, with epinephrine and 0.25% marcaine  ASA Class: 2  Procedure Detail TRANSVAGINAL TAPE (TVT) PROCEDURE, CYSTOSCOPY  Findings: Exam under anesthesia revealed a hypermobile urethra, second degree cystocele 1st degree rectocele. Absent cervix, uterus, no palpable masses in adnexa. Cystoscopy after placement of the sling demonstrated the absence of a foreign body in the bladder and the urethra. The bladder was visualized throughout 360 degrees including the bladder dome and trigone. The ureters were noted to be patent bilaterally.   Estimated Blood Loss: 5 mL   Drains: Foley Catheter during case   Total IV Fluids: 650 ml  Blood Given: none    Specimens: none   Implants: TVT mid-urethral sling   Complications: * No complications entered in OR log *   Disposition: PACU - hemodynamically stable.   Condition: stable   Technique Patient was taken to the operating room where General Anesthesia was administered. She was placed in dorsal lithotomy position and examined under anesthesia with findings noted above. Patient was prepped and draped in normal sterile fashion. A Foley catheter was placed into the bladder and it was emptied. The anterior vagina was infiltrated in the submucosal layer with 20cc of dilute lidocaine with epinephrine. The vagina was held with Allis clamps and using a 15 blade scalpel, a midline incision was made through the vaginal mucosa beginning at the apex and extending to within 1cm of the external  urethral meatus. The vaginal mucosa was sharply dissected laterally using Metzenbaum scissors. The paraurethral and paravescial fascia was mobilized by blunt dissection using a single layer of gauze sponge. Careful submucosal dissection was performed to identify the endopelvic fascia. Two small stab incisions using a #15 scalpel was made along the skin of the suprapubic region 2cm lateral to the midline on either side. Gynecare TVTexact trocar with mesh was then placed through vaginal tract going through  the space of Retzius, across the back of the pubis to exit through the stab incisions in the skin, along the right side of the urethra. The identical process was performed to the left of the urethra without difficulty. Both trocars were left in place. Foley catheter was removed and cystoscope was inserted.  Complete evaluation of the bladder mucosa was performed noting no lacerations, dimpling, tears, bleeding or trocar penetration of the mucosa or muscular layers. Both ureteral orifices were identified. Brisk Ureteral jets seen bilaterally. The cystoscope was withdrawn. Both trocars were probed through the abdominal skin to place the vaginal tape beneath the midurethra. Careful tensioning of the tape was performed using a mayo scissor placed vertically beneath the urethra to allow proper tensioning. Sheaths were removed from both mesh tapes. Tapes were pinned with the skin line and the skin was closed Dermabond. The anterior repair was performed by placing a vertical running, locked suture of 4-0 monocryl. Topical lidocaine was placed into the vagina, and Dermabond over the mons stab incision. The patient awakened from general anesthesia without difficulty. Sponge, instrument and needle counts were correct x3 .  There were no complications. The patient was transferred to the recovery room in satisfactory condition. She will be discharged home today after  bladder testing for retention.  Sheldon Sem,  Rubee Vega STACIA

## 2019-08-10 NOTE — Transfer of Care (Signed)
Immediate Anesthesia Transfer of Care Note  Patient: Pamela Best  Procedure(s) Performed: TRANSVAGINAL TAPE (TVT) PROCEDURE (N/A ) CYSTOSCOPY (Bilateral )  Patient Location: PACU  Anesthesia Type:General  Level of Consciousness: awake and alert   Airway & Oxygen Therapy: Patient Spontanous Breathing and Patient connected to nasal cannula oxygen  Post-op Assessment: Report given to RN and Post -op Vital signs reviewed and stable  Post vital signs: Reviewed and stable  Last Vitals:  Vitals Value Taken Time  BP 137/83 08/10/19 1358  Temp    Pulse 82 08/10/19 1359  Resp 11 08/10/19 1359  SpO2 100 % 08/10/19 1359  Vitals shown include unvalidated device data.  Last Pain:  Vitals:   08/10/19 1057  TempSrc: Oral  PainSc: 0-No pain      Patients Stated Pain Goal: 5 (08/10/19 1057)  Complications: No complications documented.

## 2019-08-10 NOTE — Anesthesia Postprocedure Evaluation (Signed)
Anesthesia Post Note  Patient: COSANDRA PLOUFFE  Procedure(s) Performed: TRANSVAGINAL TAPE (TVT) PROCEDURE (N/A ) CYSTOSCOPY (Bilateral )     Patient location during evaluation: PACU Anesthesia Type: General Level of consciousness: awake and alert Pain management: pain level controlled Vital Signs Assessment: post-procedure vital signs reviewed and stable Respiratory status: spontaneous breathing, nonlabored ventilation, respiratory function stable and patient connected to nasal cannula oxygen Cardiovascular status: blood pressure returned to baseline and stable Postop Assessment: no apparent nausea or vomiting Anesthetic complications: no   No complications documented.  Last Vitals:  Vitals:   08/10/19 1430 08/10/19 1445  BP: (!) 142/85 139/82  Pulse: 76 81  Resp: 11 16  Temp:    SpO2: 100% 98%    Last Pain:  Vitals:   08/10/19 1645  TempSrc:   PainSc: 4                  Melat Wrisley L Maaz Spiering

## 2019-08-11 ENCOUNTER — Encounter (HOSPITAL_BASED_OUTPATIENT_CLINIC_OR_DEPARTMENT_OTHER): Payer: Self-pay | Admitting: Obstetrics & Gynecology
